# Patient Record
Sex: Male | Born: 1950
Health system: Southern US, Community
[De-identification: ages and names within clinical notes are randomized; demographics above are authoritative.]

## PROBLEM LIST (undated history)

## (undated) DIAGNOSIS — I10 Essential (primary) hypertension: Secondary | ICD-10-CM

## (undated) DIAGNOSIS — C61 Malignant neoplasm of prostate: Secondary | ICD-10-CM

## (undated) DIAGNOSIS — C4490 Unspecified malignant neoplasm of skin, unspecified: Secondary | ICD-10-CM

## (undated) DIAGNOSIS — E7800 Pure hypercholesterolemia, unspecified: Secondary | ICD-10-CM

## (undated) DIAGNOSIS — E119 Type 2 diabetes mellitus without complications: Secondary | ICD-10-CM

## (undated) HISTORY — PX: COLONOSCOPY: SHX5424

---

## 1998-06-01 ENCOUNTER — Encounter: Admission: RE | Admit: 1998-06-01 | Discharge: 1998-08-30 | Payer: Self-pay | Admitting: Internal Medicine

## 2012-01-07 ENCOUNTER — Other Ambulatory Visit: Payer: Self-pay | Admitting: Orthopedic Surgery

## 2012-01-07 DIAGNOSIS — M549 Dorsalgia, unspecified: Secondary | ICD-10-CM

## 2012-01-07 DIAGNOSIS — M79604 Pain in right leg: Secondary | ICD-10-CM

## 2012-01-07 DIAGNOSIS — M47817 Spondylosis without myelopathy or radiculopathy, lumbosacral region: Secondary | ICD-10-CM

## 2012-01-10 ENCOUNTER — Ambulatory Visit
Admission: RE | Admit: 2012-01-10 | Discharge: 2012-01-10 | Disposition: A | Discharge status: Home / Self Care | Payer: Self-pay | Source: Ambulatory Visit | Attending: Orthopedic Surgery | Admitting: Orthopedic Surgery

## 2012-01-10 ENCOUNTER — Ambulatory Visit
Admission: RE | Admit: 2012-01-10 | Discharge: 2012-01-10 | Disposition: A | Discharge status: Home / Self Care | Payer: BC Managed Care – PPO | Source: Ambulatory Visit | Attending: Orthopedic Surgery | Admitting: Orthopedic Surgery

## 2012-01-10 ENCOUNTER — Other Ambulatory Visit: Payer: Self-pay

## 2012-01-10 ENCOUNTER — Other Ambulatory Visit: Payer: Self-pay | Admitting: Orthopedic Surgery

## 2012-01-10 VITALS — BP 128/78 | HR 80 | Ht 68.0 in | Wt 190.0 lb

## 2012-01-10 DIAGNOSIS — M549 Dorsalgia, unspecified: Secondary | ICD-10-CM

## 2012-01-10 DIAGNOSIS — R52 Pain, unspecified: Secondary | ICD-10-CM

## 2012-01-10 DIAGNOSIS — M47817 Spondylosis without myelopathy or radiculopathy, lumbosacral region: Secondary | ICD-10-CM

## 2012-01-10 DIAGNOSIS — M79604 Pain in right leg: Secondary | ICD-10-CM

## 2012-01-10 MED ORDER — DIAZEPAM 5 MG PO TABS
10.0000 mg | ORAL_TABLET | Freq: Once | ORAL | Status: AC
  Administered 2012-01-10: 10 mg via ORAL

## 2012-01-10 MED ORDER — IOHEXOL 180 MG/ML  SOLN
18.0000 mL | Freq: Once | INTRAMUSCULAR | Status: AC | PRN
  Administered 2012-01-10: 18 mL via INTRATHECAL

## 2012-01-16 ENCOUNTER — Other Ambulatory Visit (HOSPITAL_COMMUNITY): Payer: Self-pay | Admitting: Orthopedic Surgery

## 2012-01-16 DIAGNOSIS — M549 Dorsalgia, unspecified: Secondary | ICD-10-CM

## 2012-01-25 ENCOUNTER — Ambulatory Visit (HOSPITAL_COMMUNITY): Payer: BC Managed Care – PPO

## 2012-01-25 ENCOUNTER — Encounter (HOSPITAL_COMMUNITY): Payer: BC Managed Care – PPO

## 2013-04-09 IMAGING — RF DG MYELOGRAM LUMBAR
11 series · 11 of 11 positions shown · IV contrast (omnipaque)
Comparison: MRI 11/15/2011 done at [REDACTED]

CLINICAL DATA: Right hip and groin pain.

 MYELOGRAM INJECTION
TECHNIQUE: Informed consent was obtained from the patient prior to
the procedure, including potential complications of headache,
allergy, infection and pain.  A timeout procedure was performed.
With the patient prone, the lower back was prepped with Betadine.
1% Lidocaine was used for local anesthesia.  Lumbar puncture was
performed at the left L3-4 level using a 22 gauge needle with
return of clear CSF.  16 ml of Omnipaque 488was injected into the
subarachnoid space .
TECHNIQUE: Following injection of intrathecal Omnipaque contrast,
spine imaging in multiple projections was performed using
fluoroscopy.
Fluoroscopy Time: 52 seconds .
TECHNIQUE: CT imaging of the lumbar spine was performed after
intrathecal contrast administration.  Multiplanar CT image
reconstructions were also generated.

[Series 1: (hospital) · 1 of 1 slices shown]
[im 1/1]
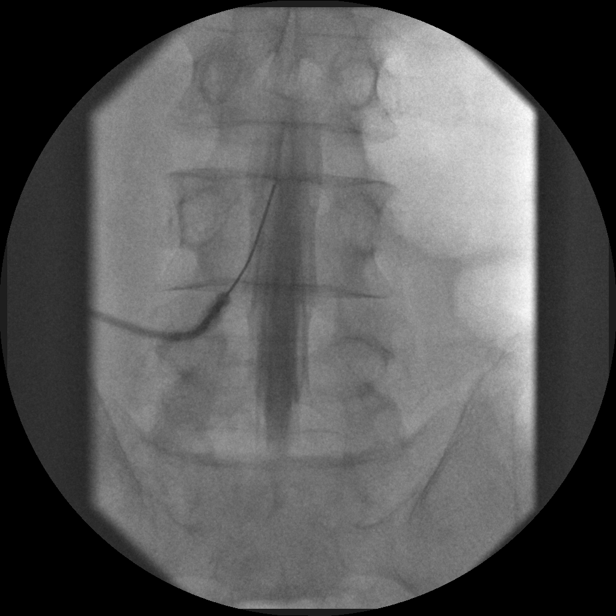

[Series 2: myelogram  white · 1 of 1 slices shown (1 of 7)]
[im 1/1]
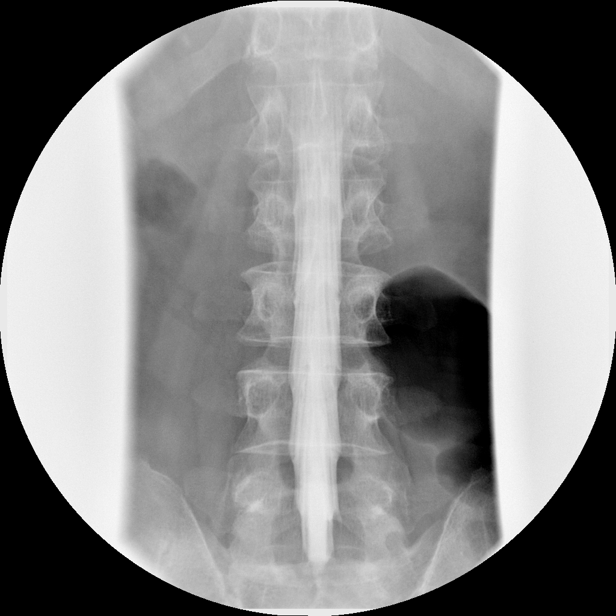

[Series 3: myelogram  white · 1 of 1 slices shown (2 of 7)]
[im 1/1]
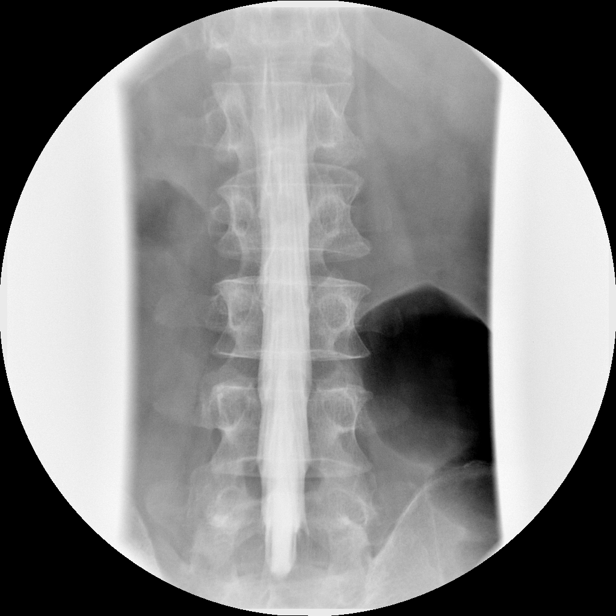

[Series 4: myelogram  white · 1 of 1 slices shown (3 of 7)]
[im 1/1]
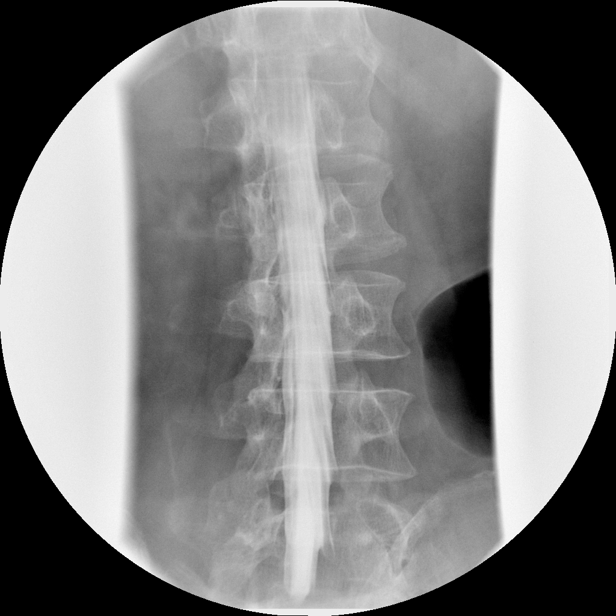

[Series 5: myelogram  white · 1 of 1 slices shown (4 of 7)]
[im 1/1]
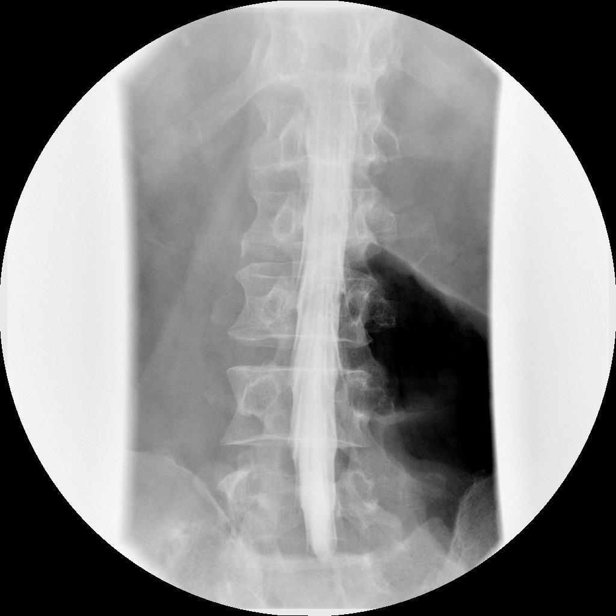

[Series 6: myelogram  white · 1 of 1 slices shown (5 of 7)]
[im 1/1]
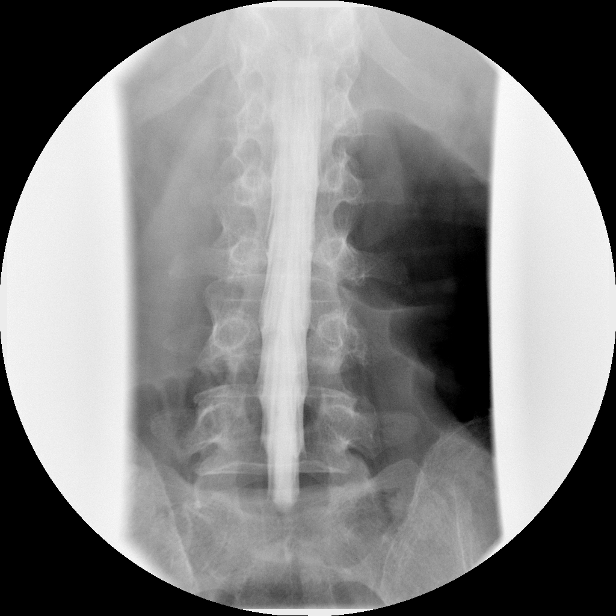

[Series 7: myelogram  white · 1 of 1 slices shown (6 of 7)]
[im 1/1]
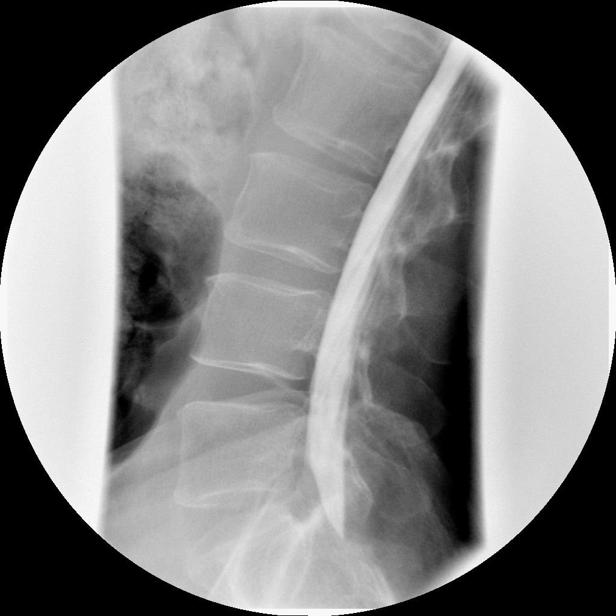

[Series 8: myelogram  white · 1 of 1 slices shown (7 of 7)]
[im 1/1]
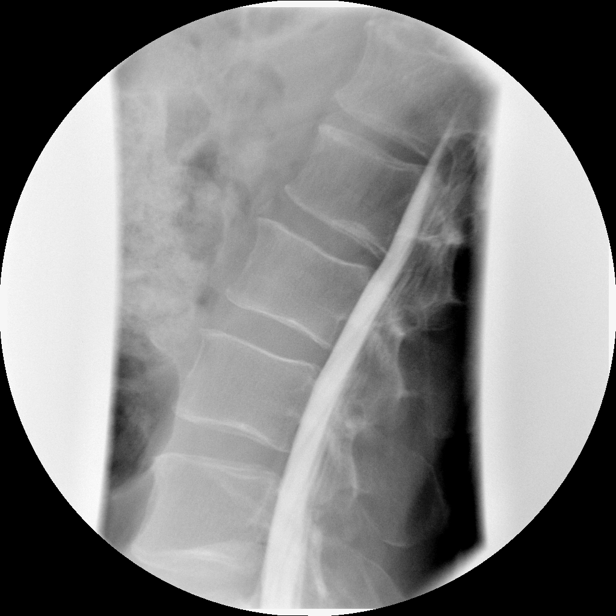

[Series 1001: view not recorded · 0.20mm/px · 1 of 1 slices shown (1 of 3)]
[im 1/1]
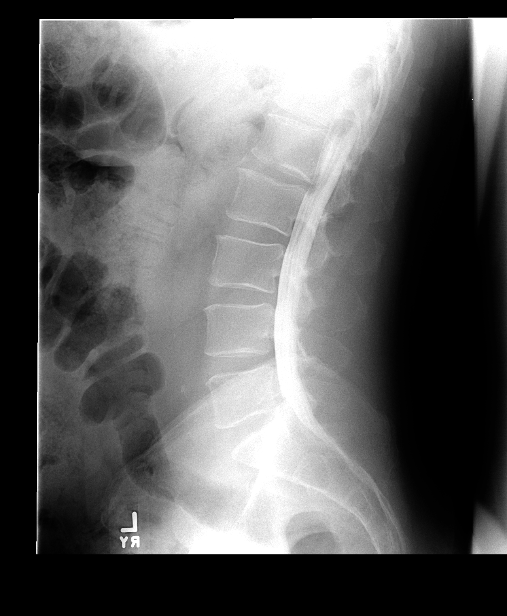

[Series 1002: view not recorded · 0.20mm/px · 1 of 1 slices shown (2 of 3)]
[im 1/1]
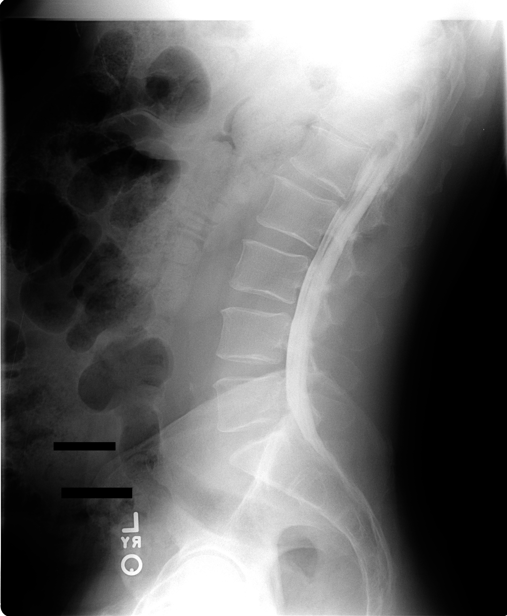

[Series 1003: view not recorded · 0.20mm/px · 1 of 1 slices shown (3 of 3)]
[im 1/1]
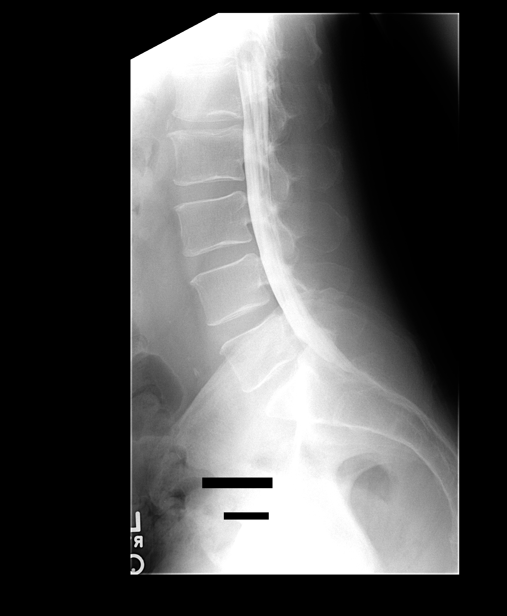

[11 of 11 positions shown; findings below may reference images not displayed]

IMPRESSION: Successful injection of  intrathecal contrast for myelography.

MYELOGRAM LUMBAR
FINDINGS: There are no anterior extradural defects.  There is
normal root sleeve filling throughout the region.  Standing
flexion/extension views do not show any abnormal motion.
IMPRESSION: Normal lumbar myelogram

CT MYELOGRAPHY LUMBAR SPINE
FINDINGS: T12-L1:  Normal interspace.

L1-2:  Conus tip at L1.  Nerve roots of the cauda quinine appear
normal.  Minimal leftward bulging of the disc.  No apparent neural
compression.

L2-3:  Normal interspace.

L3-4:  Normal interspace.

L4-5:  Normal appearance of the disc.  Very minimal facet
hypertrophy.  No stenosis or neural compression.

L5-S1:  Normal appearance of the disc.  No stenosis.

There are solid bridging osteophytes crossing the sacroiliac joint
on the left.  There is sacroiliac arthropathy on the right without
effusion.  Could this be a cause of pain?

Paravertebral soft tissues are unremarkable except for some
calcification of the thoracic aorta.

There is a 2 mm nodule associated with a left sided nerve root at
the level of the superior endplate of L5.  This looks like the left
S2 nerve root.
IMPRESSION: No significant disc pathology.  Normal exam of the discs with the
exception of a very minor leftward disc bulge at L1-2.

Sacroiliac arthropathy with solid bridging osteophytes on the left
and arthropathy on the right that could possibly be symptomatic.
Could the patient's clinical syndrome relate to sacroiliac
arthropathy?

Very minimal facet degeneration at L4-5.

2 mm nodule along a left sided nerve root at the level of the
superior endplate of L5.  This looks like the left S2 nerve root.
This is presumed to represent a small insignificant neurofibroma.

## 2016-07-04 HISTORY — PX: PROSTATE BIOPSY: SHX241

## 2016-08-30 ENCOUNTER — Encounter: Payer: Self-pay | Admitting: Radiation Oncology

## 2016-09-11 ENCOUNTER — Encounter: Payer: Self-pay | Admitting: Radiation Oncology

## 2016-09-11 DIAGNOSIS — C61 Malignant neoplasm of prostate: Secondary | ICD-10-CM | POA: Insufficient documentation

## 2016-09-11 NOTE — Progress Notes (Signed)
GU Location of Tumor / Histology: prostatic adenocarcinoma  If Prostate Cancer, Gleason Score is (3 + 4) and PSA is (4.76). Prostate volume: 22.1 cc.  Anthony Moyer was referred to Dr. Diona Fanti by Dr. Marcellus Scott for evaluation of an elevated PSA in June 2018.   02/22/2015 PSA 3.57 03/16/2016 PSA 4.40 04/17/2016 PSA 4.76   Biopsies of prostate (if applicable) revealed:    Past/Anticipated interventions by urology, if any: consult, biopsy, referral to radiation oncology to discuss brachytherapy  Past/Anticipated interventions by medical oncology, if any: No  Weight changes, if any: no  Bowel/Bladder complaints, if any: IPSS:  2   . Nocturia x 1.  Denies dysuria, hematuria, leakage or incontinence.  Nausea/Vomiting, if any: no  Pain issues, if any:  Occasional heaviness in groin "where prostate is."  SAFETY ISSUES:  Prior radiation? no  Pacemaker/ICD? no  Possible current pregnancy? no  Is the patient on methotrexate? no  Current Complaints / other details:  66 year old male. Married with one son and one daughter. Works as a Engineer, maintenance (IT).   Most interested in brachytherapy. Wife concerned about healing since he is a diabetic. Patient questions if he should cancel colonoscopy in October???

## 2016-09-12 ENCOUNTER — Encounter: Payer: Self-pay | Admitting: Radiation Oncology

## 2016-09-12 ENCOUNTER — Ambulatory Visit
Admission: RE | Admit: 2016-09-12 | Discharge: 2016-09-12 | Disposition: A | Discharge status: Home / Self Care | Payer: BC Managed Care – PPO | Source: Ambulatory Visit | Attending: Radiation Oncology | Admitting: Radiation Oncology

## 2016-09-12 DIAGNOSIS — Z85820 Personal history of malignant melanoma of skin: Secondary | ICD-10-CM | POA: Diagnosis not present

## 2016-09-12 DIAGNOSIS — Z510 Encounter for antineoplastic radiation therapy: Secondary | ICD-10-CM | POA: Insufficient documentation

## 2016-09-12 DIAGNOSIS — C61 Malignant neoplasm of prostate: Secondary | ICD-10-CM | POA: Diagnosis present

## 2016-09-12 DIAGNOSIS — Z7982 Long term (current) use of aspirin: Secondary | ICD-10-CM | POA: Diagnosis not present

## 2016-09-12 DIAGNOSIS — E7800 Pure hypercholesterolemia, unspecified: Secondary | ICD-10-CM | POA: Diagnosis not present

## 2016-09-12 DIAGNOSIS — I10 Essential (primary) hypertension: Secondary | ICD-10-CM | POA: Insufficient documentation

## 2016-09-12 HISTORY — DX: Unspecified malignant neoplasm of skin, unspecified: C44.90

## 2016-09-12 HISTORY — DX: Malignant neoplasm of prostate: C61

## 2016-09-12 HISTORY — DX: Essential (primary) hypertension: I10

## 2016-09-12 HISTORY — DX: Pure hypercholesterolemia, unspecified: E78.00

## 2016-09-12 NOTE — Progress Notes (Signed)
Radiation Oncology         [phone] ________________________________  Initial Outpatient Consultation  Name: Anthony Moyer MRN: [id number]  Date: 09/12/2016  DOB: 11-20-1950  CH:YIFOYD, Pcp Not In  Franchot Gallo, MD   REFERRING PHYSICIAN: Franchot Gallo, MD  DIAGNOSIS: 66 y.o. gentleman with Stage T2a adenocarcinoma of the prostate with Gleason Score of 3+4 and PSA of 4.76    ICD-10-CM   1. Malignant neoplasm of prostate ([place]) C61     HISTORY OF PRESENT ILLNESS: Anthony Moyer is a 66 y.o. male with a diagnosis of prostate cancer. He was noted to have an elevated PSA of 4.76 by his primary care physician, Dr. Marcellus Scott.  Accordingly, he was referred for evaluation in urology by Dr. Diona Fanti on 06/11/2016, where a digital rectal examination was performed at that time revealing a 4 mm prostate nodule in the right mid gland.  The patient proceeded to transrectal ultrasound with 12 biopsies of the prostate on 07/04/2016.  The prostate volume measured 22.1 cc.  Out of 12 core biopsies, 5 were positive.  The maximum Gleason score was 3+4, and this was seen in the right mid lateral, right apex and right apex lateral. There was Gleason 3+3 disease in the right mid and right base lateral.  He has no family history of prostate cancer.  Biopsies of prostate revealed:   PSA trend over the past year: 02/22/2015        PSA     3.57 03/16/2016          PSA     4.40 04/17/2016        PSA     4.76       The patient reviewed the biopsy results with his urologist and he has kindly been referred today for discussion of potential radiation treatment options. He is accompanied by his wife.  PREVIOUS RADIATION THERAPY: No  PAST MEDICAL HISTORY:  Past Medical History:  Diagnosis Date  . Hypercholesterolemia   . Hypertension   . Prostate cancer ([place])   . Skin cancer    melanoma in situ, unspecified      PAST SURGICAL HISTORY: Past Surgical History:  Procedure Laterality Date  .  PROSTATE BIOPSY  07/04/2016    FAMILY HISTORY:  Family History  Problem Relation Age of Onset  . Diabetes Mother   . Diabetes Father   . Heart failure Father   . Cancer Father        melanoma    SOCIAL HISTORY: Married with one son and one daughter. Works as a Engineer, maintenance (IT).  Social History   Social History  . Marital status: Married    Spouse name: N/A  . Number of children: N/A  . Years of education: N/A   Occupational History  . Not on file.   Social History Main Topics  . Smoking status: Never Smoker  . Smokeless tobacco: Never Used  . Alcohol use Yes  . Drug use: No  . Sexual activity: Not on file   Other Topics Concern  . Not on file   Social History Narrative  . No narrative on file    ALLERGIES: Penicillins  MEDICATIONS:  Current Outpatient Prescriptions  Medication Sig Dispense Refill  . insulin degludec (TRESIBA FLEXTOUCH) 100 UNIT/ML SOPN FlexTouch Pen Inject into the skin daily at 10 pm.    . lisinopril (PRINIVIL,ZESTRIL) 5 MG tablet Take 5 mg by mouth daily.    . metFORMIN (GLUCOPHAGE) 1000 MG tablet Take 1,000 mg  by mouth 2 (two) times daily with a meal.    . simvastatin (ZOCOR) 20 MG tablet Take 20 mg by mouth daily.    Marland Kitchen aspirin EC 81 MG tablet Take 81 mg by mouth daily.    . cholecalciferol (VITAMIN D) 1000 units tablet Take 1,000 Units by mouth daily.     No current facility-administered medications for this encounter.     REVIEW OF SYSTEMS:  On review of systems, the patient reports that he is doing well overall. He denies any chest pain, shortness of breath, cough, fevers, chills, night sweats, or unintended weight changes. He reports feeling occasional heaviness in his groin. He denies any bowel disturbances, and denies abdominal pain, nausea or vomiting. He denies any new musculoskeletal or joint aches or pains. He is a diabetic and has peripheral neuropathy in his feet. His IPSS was 2, indicating mild urinary symptoms, including nocturia x1. He  denies dysuria, hematuria, leakage or incontinence. He is unable to complete sexual activity with most attempts. A complete review of systems is obtained and is otherwise negative.    PHYSICAL EXAM:  Wt Readings from Last 3 Encounters:  09/12/16 190 lb 9.6 oz (86.5 kg)  01/10/12 190 lb (86.2 kg)   Temp Readings from Last 3 Encounters:  09/12/16 98.2 F (36.8 C) (Oral)   BP Readings from Last 3 Encounters:  09/12/16 (!) 145/75  01/10/12 128/78   Pulse Readings from Last 3 Encounters:  09/12/16 80  01/10/12 80   Pain Assessment Pain Score: 0-No pain/10  In general this is a well appearing Caucasian male in no acute distress. He is alert and oriented x4 and appropriate throughout the examination. Cardiovascular exam reveals a regular rate and rhythm, no clicks rubs or murmurs are auscultated. Chest is clear to auscultation bilaterally. Lymphatic assessment is performed and does not reveal any adenopathy in the cervical, supraclavicular, axillary, or inguinal chains. Abdomen has active bowel sounds in all quadrants and is intact. The abdomen is soft, non tender, non distended. Lower extremities are negative for pretibial pitting edema, deep calf tenderness, cyanosis or clubbing.   KPS = 100  100 - Normal; no complaints; no evidence of disease. 90   - Able to carry on normal activity; minor signs or symptoms of disease. 80   - Normal activity with effort; some signs or symptoms of disease. 60   - Cares for self; unable to carry on normal activity or to do active work. 60   - Requires occasional assistance, but is able to care for most of his personal needs. 50   - Requires considerable assistance and frequent medical care. 36   - Disabled; requires special care and assistance. 66   - Severely disabled; hospital admission is indicated although death not imminent. 80   - Very sick; hospital admission necessary; active supportive treatment necessary. 10   - Moribund; fatal processes  progressing rapidly. 0     - Dead  Karnofsky DA, Abelmann WH, Craver LS and Burchenal JH [phone]) The use of the nitrogen mustards in the palliative treatment of carcinoma: with particular reference to bronchogenic carcinoma Cancer 1 [phone]  LABORATORY DATA:  No results found for: WBC, HGB, HCT, MCV, PLT No results found for: NA, K, CL, CO2 No results found for: ALT, AST, GGT, ALKPHOS, BILITOT   RADIOGRAPHY: No results found.    IMPRESSION/PLAN: 1. 67 y.o. gentleman with Stage T2a adenocarcinoma of the prostate with Gleason Score of 3+4, and PSA of 4.76.  We  discussed the patient's workup and outlined the nature of prostate cancer in this setting. The patient's T stage, Gleason's score, and PSA put him into the favorable intermediate risk group. Accordingly, he is eligible for a variety of potential treatment options including brachytherapy or 8 weeks of external radiation. We discussed the available radiation techniques and focused on the details and logistics and delivery.  We discussed and outlined the risks, benefits, short and long-term effects associated with radiotherapy and compared and contrasted these with prostatectomy. The patient expressed interest in external beam radiotherapy as he has concerns about post procedure LUTS as well as radiation safety around his 89 month old granddaughter.  At the end of our discussion, the patient elects to proceed with prostate IMRT. He is scheduled for his annual colonoscopy on October 9th and would like to proceed with EBRT after this procedure is completed.  We will share our findings with Dr. Diona Fanti and move forward with scheduling placement of three gold fiducial markers into the prostate to proceed with IMRT in the near future.  It is felt that the patient may not benefit as much from the [place] given the small size of his prostate but we will plan to further discuss with Dr. Diona Fanti regarding his opinion.  We enjoyed meeting with him today,  and will look forward to participating in the care of this very nice gentleman. We spent 60 minutes face to face with the patient and more than 50% of that time was spent in counseling and/or coordination of care.   Nicholos Johns, PA-C    Tyler Pita, MD  [facility] Direct Dial: [phone]  Fax: [phone] [email or web address]  Skype  [email or web address]  This document serves as a record of services personally performed by Tyler Pita, MD and Freeman Caldron, PA-C. It was created on their behalf by Rae Lips, a trained medical scribe. The creation of this record is based on the scribe's personal observations and the providers' statements to them. This document has been checked and approved by the attending providers.

## 2016-09-12 NOTE — Progress Notes (Signed)
See progress noted under physician encounter.  

## 2016-09-13 ENCOUNTER — Telehealth: Payer: Self-pay | Admitting: Medical Oncology

## 2016-09-13 NOTE — Telephone Encounter (Signed)
Left a message to introduce myself as nurse navigator. I was unable to meet him yesterday when he consulted with Dr. Tammi Klippel. He will begin radiation in the fall for his prostate cancer. I asked him to call with questions or concerns.

## 2016-10-05 ENCOUNTER — Telehealth: Payer: Self-pay | Admitting: *Deleted

## 2016-10-05 NOTE — Telephone Encounter (Signed)
Called patient to inform of gold seed placement on 10-12-16 @ 10:30 am  @ Dr. Alan Ripper  Office, and sim on 10-18-16 @ 11:00 am @ Dr. Johny Shears Office, spoke with patient's wife- Ivin Booty and she is aware of these appts.

## 2016-10-17 NOTE — Progress Notes (Signed)
  Radiation Oncology         [phone] ________________________________  Name: Anthony Moyer MRN: [id number]  Date: 10/18/2016  DOB: 1950/05/06  SIMULATION AND TREATMENT PLANNING NOTE    ICD-10-CM   1. Malignant neoplasm of prostate ([place]) C61     DIAGNOSIS:  66 y.o. gentleman with Stage T2a adenocarcinoma of the prostate with Gleason Score of 3+4 and PSA of 4.76  NARRATIVE:  The patient was brought to the [facility].  Identity was confirmed.  All relevant records and images related to the planned course of therapy were reviewed.  The patient freely provided informed written consent to proceed with treatment after reviewing the details related to the planned course of therapy. The consent form was witnessed and verified by the simulation staff.  Then, the patient was set-up in a stable reproducible supine position for radiation therapy.  A vacuum lock pillow device was custom fabricated to position his legs in a reproducible immobilized position.  Then, I performed a urethrogram under sterile conditions to identify the prostatic apex.  CT images were obtained.  Surface markings were placed.  The CT images were loaded into the planning software.  Then the prostate target and avoidance structures including the rectum, bladder, bowel and hips were contoured.  Treatment planning then occurred.  The radiation prescription was entered and confirmed.  A total of one complex treatment devices was fabricated. I have requested : Intensity Modulated Radiotherapy (IMRT) is medically necessary for this case for the following reason:  Rectal sparing.Marland Kitchen  PLAN:  The patient will receive 78 Gy in 40 fractions.  ________________________________  Sheral Apley Tammi Klippel, M.D.

## 2016-10-18 ENCOUNTER — Encounter: Payer: Self-pay | Admitting: Medical Oncology

## 2016-10-18 ENCOUNTER — Ambulatory Visit
Admission: RE | Admit: 2016-10-18 | Discharge: 2016-10-18 | Disposition: A | Discharge status: Home / Self Care | Payer: BC Managed Care – PPO | Source: Ambulatory Visit | Attending: Radiation Oncology | Admitting: Radiation Oncology

## 2016-10-18 DIAGNOSIS — C61 Malignant neoplasm of prostate: Secondary | ICD-10-CM

## 2016-10-18 DIAGNOSIS — Z510 Encounter for antineoplastic radiation therapy: Secondary | ICD-10-CM | POA: Diagnosis not present

## 2016-10-24 DIAGNOSIS — Z510 Encounter for antineoplastic radiation therapy: Secondary | ICD-10-CM | POA: Diagnosis not present

## 2016-10-29 ENCOUNTER — Ambulatory Visit
Admission: RE | Admit: 2016-10-29 | Discharge: 2016-10-29 | Disposition: A | Discharge status: Home / Self Care | Payer: BC Managed Care – PPO | Source: Ambulatory Visit | Attending: Radiation Oncology | Admitting: Radiation Oncology

## 2016-10-29 DIAGNOSIS — Z510 Encounter for antineoplastic radiation therapy: Secondary | ICD-10-CM | POA: Diagnosis not present

## 2016-10-30 ENCOUNTER — Ambulatory Visit
Admission: RE | Admit: 2016-10-30 | Discharge: 2016-10-30 | Disposition: A | Discharge status: Home / Self Care | Payer: BC Managed Care – PPO | Source: Ambulatory Visit | Attending: Radiation Oncology | Admitting: Radiation Oncology

## 2016-10-30 DIAGNOSIS — Z510 Encounter for antineoplastic radiation therapy: Secondary | ICD-10-CM | POA: Diagnosis not present

## 2016-10-31 ENCOUNTER — Ambulatory Visit
Admission: RE | Admit: 2016-10-31 | Discharge: 2016-10-31 | Disposition: A | Discharge status: Home / Self Care | Payer: BC Managed Care – PPO | Source: Ambulatory Visit | Attending: Radiation Oncology | Admitting: Radiation Oncology

## 2016-10-31 DIAGNOSIS — Z510 Encounter for antineoplastic radiation therapy: Secondary | ICD-10-CM | POA: Diagnosis not present

## 2016-11-01 ENCOUNTER — Ambulatory Visit
Admission: RE | Admit: 2016-11-01 | Discharge: 2016-11-01 | Disposition: A | Discharge status: Home / Self Care | Payer: BC Managed Care – PPO | Source: Ambulatory Visit | Attending: Radiation Oncology | Admitting: Radiation Oncology

## 2016-11-01 DIAGNOSIS — Z510 Encounter for antineoplastic radiation therapy: Secondary | ICD-10-CM | POA: Diagnosis not present

## 2016-11-02 ENCOUNTER — Ambulatory Visit
Admission: RE | Admit: 2016-11-02 | Discharge: 2016-11-02 | Disposition: A | Discharge status: Home / Self Care | Payer: BC Managed Care – PPO | Source: Ambulatory Visit | Attending: Radiation Oncology | Admitting: Radiation Oncology

## 2016-11-02 DIAGNOSIS — Z510 Encounter for antineoplastic radiation therapy: Secondary | ICD-10-CM | POA: Diagnosis not present

## 2016-11-05 ENCOUNTER — Ambulatory Visit
Admission: RE | Admit: 2016-11-05 | Discharge: 2016-11-05 | Disposition: A | Discharge status: Home / Self Care | Payer: BC Managed Care – PPO | Source: Ambulatory Visit | Attending: Radiation Oncology | Admitting: Radiation Oncology

## 2016-11-05 DIAGNOSIS — Z510 Encounter for antineoplastic radiation therapy: Secondary | ICD-10-CM | POA: Diagnosis not present

## 2016-11-06 ENCOUNTER — Ambulatory Visit
Admission: RE | Admit: 2016-11-06 | Discharge: 2016-11-06 | Disposition: A | Discharge status: Home / Self Care | Payer: BC Managed Care – PPO | Source: Ambulatory Visit | Attending: Radiation Oncology | Admitting: Radiation Oncology

## 2016-11-06 DIAGNOSIS — Z510 Encounter for antineoplastic radiation therapy: Secondary | ICD-10-CM | POA: Diagnosis not present

## 2016-11-07 ENCOUNTER — Ambulatory Visit
Admission: RE | Admit: 2016-11-07 | Discharge: 2016-11-07 | Disposition: A | Discharge status: Home / Self Care | Payer: BC Managed Care – PPO | Source: Ambulatory Visit | Attending: Radiation Oncology | Admitting: Radiation Oncology

## 2016-11-07 DIAGNOSIS — Z510 Encounter for antineoplastic radiation therapy: Secondary | ICD-10-CM | POA: Diagnosis not present

## 2016-11-08 ENCOUNTER — Ambulatory Visit
Admission: RE | Admit: 2016-11-08 | Discharge: 2016-11-08 | Disposition: A | Discharge status: Home / Self Care | Payer: BC Managed Care – PPO | Source: Ambulatory Visit | Attending: Radiation Oncology | Admitting: Radiation Oncology

## 2016-11-08 DIAGNOSIS — Z510 Encounter for antineoplastic radiation therapy: Secondary | ICD-10-CM | POA: Diagnosis not present

## 2016-11-09 ENCOUNTER — Ambulatory Visit
Admission: RE | Admit: 2016-11-09 | Discharge: 2016-11-09 | Disposition: A | Discharge status: Home / Self Care | Payer: BC Managed Care – PPO | Source: Ambulatory Visit | Attending: Radiation Oncology | Admitting: Radiation Oncology

## 2016-11-09 DIAGNOSIS — Z510 Encounter for antineoplastic radiation therapy: Secondary | ICD-10-CM | POA: Diagnosis not present

## 2016-11-12 ENCOUNTER — Ambulatory Visit
Admission: RE | Admit: 2016-11-12 | Discharge: 2016-11-12 | Disposition: A | Discharge status: Home / Self Care | Payer: BC Managed Care – PPO | Source: Ambulatory Visit | Attending: Radiation Oncology | Admitting: Radiation Oncology

## 2016-11-12 DIAGNOSIS — Z510 Encounter for antineoplastic radiation therapy: Secondary | ICD-10-CM | POA: Diagnosis not present

## 2016-11-13 ENCOUNTER — Ambulatory Visit
Admission: RE | Admit: 2016-11-13 | Discharge: 2016-11-13 | Disposition: A | Discharge status: Home / Self Care | Payer: BC Managed Care – PPO | Source: Ambulatory Visit | Attending: Radiation Oncology | Admitting: Radiation Oncology

## 2016-11-13 DIAGNOSIS — Z510 Encounter for antineoplastic radiation therapy: Secondary | ICD-10-CM | POA: Diagnosis not present

## 2016-11-14 ENCOUNTER — Ambulatory Visit
Admission: RE | Admit: 2016-11-14 | Discharge: 2016-11-14 | Disposition: A | Discharge status: Home / Self Care | Payer: BC Managed Care – PPO | Source: Ambulatory Visit | Attending: Radiation Oncology | Admitting: Radiation Oncology

## 2016-11-14 DIAGNOSIS — Z510 Encounter for antineoplastic radiation therapy: Secondary | ICD-10-CM | POA: Diagnosis not present

## 2016-11-15 ENCOUNTER — Ambulatory Visit
Admission: RE | Admit: 2016-11-15 | Discharge: 2016-11-15 | Disposition: A | Discharge status: Home / Self Care | Payer: BC Managed Care – PPO | Source: Ambulatory Visit | Attending: Radiation Oncology | Admitting: Radiation Oncology

## 2016-11-15 DIAGNOSIS — Z510 Encounter for antineoplastic radiation therapy: Secondary | ICD-10-CM | POA: Diagnosis not present

## 2016-11-16 ENCOUNTER — Ambulatory Visit
Admission: RE | Admit: 2016-11-16 | Discharge: 2016-11-16 | Disposition: A | Discharge status: Home / Self Care | Payer: BC Managed Care – PPO | Source: Ambulatory Visit | Attending: Radiation Oncology | Admitting: Radiation Oncology

## 2016-11-16 DIAGNOSIS — Z510 Encounter for antineoplastic radiation therapy: Secondary | ICD-10-CM | POA: Diagnosis not present

## 2016-11-19 ENCOUNTER — Ambulatory Visit
Admission: RE | Admit: 2016-11-19 | Discharge: 2016-11-19 | Disposition: A | Discharge status: Home / Self Care | Payer: BC Managed Care – PPO | Source: Ambulatory Visit | Attending: Radiation Oncology | Admitting: Radiation Oncology

## 2016-11-19 DIAGNOSIS — Z510 Encounter for antineoplastic radiation therapy: Secondary | ICD-10-CM | POA: Diagnosis not present

## 2016-11-20 ENCOUNTER — Ambulatory Visit
Admission: RE | Admit: 2016-11-20 | Discharge: 2016-11-20 | Disposition: A | Discharge status: Home / Self Care | Payer: BC Managed Care – PPO | Source: Ambulatory Visit | Attending: Radiation Oncology | Admitting: Radiation Oncology

## 2016-11-20 DIAGNOSIS — Z510 Encounter for antineoplastic radiation therapy: Secondary | ICD-10-CM | POA: Diagnosis not present

## 2016-11-21 ENCOUNTER — Ambulatory Visit
Admission: RE | Admit: 2016-11-21 | Discharge: 2016-11-21 | Disposition: A | Discharge status: Home / Self Care | Payer: BC Managed Care – PPO | Source: Ambulatory Visit | Attending: Radiation Oncology | Admitting: Radiation Oncology

## 2016-11-21 DIAGNOSIS — Z510 Encounter for antineoplastic radiation therapy: Secondary | ICD-10-CM | POA: Diagnosis not present

## 2016-11-22 ENCOUNTER — Ambulatory Visit
Admission: RE | Admit: 2016-11-22 | Discharge: 2016-11-22 | Disposition: A | Discharge status: Home / Self Care | Payer: BC Managed Care – PPO | Source: Ambulatory Visit | Attending: Radiation Oncology | Admitting: Radiation Oncology

## 2016-11-22 DIAGNOSIS — Z510 Encounter for antineoplastic radiation therapy: Secondary | ICD-10-CM | POA: Diagnosis not present

## 2016-11-23 ENCOUNTER — Encounter: Payer: Self-pay | Admitting: Medical Oncology

## 2016-11-23 ENCOUNTER — Ambulatory Visit
Admission: RE | Admit: 2016-11-23 | Discharge: 2016-11-23 | Disposition: A | Discharge status: Home / Self Care | Payer: BC Managed Care – PPO | Source: Ambulatory Visit | Attending: Radiation Oncology | Admitting: Radiation Oncology

## 2016-11-23 DIAGNOSIS — Z510 Encounter for antineoplastic radiation therapy: Secondary | ICD-10-CM | POA: Diagnosis not present

## 2016-11-23 NOTE — Progress Notes (Signed)
Anthony Moyer states he is doing well with radiation. He states that he is experiencing some urinary hesitancy but no major side effects at this time.

## 2016-11-25 ENCOUNTER — Ambulatory Visit
Admission: RE | Admit: 2016-11-25 | Discharge: 2016-11-25 | Disposition: A | Discharge status: Home / Self Care | Payer: BC Managed Care – PPO | Source: Ambulatory Visit | Attending: Radiation Oncology | Admitting: Radiation Oncology

## 2016-11-25 DIAGNOSIS — Z510 Encounter for antineoplastic radiation therapy: Secondary | ICD-10-CM | POA: Diagnosis not present

## 2016-11-26 ENCOUNTER — Ambulatory Visit
Admission: RE | Admit: 2016-11-26 | Discharge: 2016-11-26 | Disposition: A | Discharge status: Home / Self Care | Payer: BC Managed Care – PPO | Source: Ambulatory Visit | Attending: Radiation Oncology | Admitting: Radiation Oncology

## 2016-11-26 DIAGNOSIS — Z510 Encounter for antineoplastic radiation therapy: Secondary | ICD-10-CM | POA: Diagnosis not present

## 2016-11-27 ENCOUNTER — Ambulatory Visit
Admission: RE | Admit: 2016-11-27 | Discharge: 2016-11-27 | Disposition: A | Discharge status: Home / Self Care | Payer: BC Managed Care – PPO | Source: Ambulatory Visit | Attending: Radiation Oncology | Admitting: Radiation Oncology

## 2016-11-27 DIAGNOSIS — Z510 Encounter for antineoplastic radiation therapy: Secondary | ICD-10-CM | POA: Diagnosis not present

## 2016-11-28 ENCOUNTER — Ambulatory Visit
Admission: RE | Admit: 2016-11-28 | Discharge: 2016-11-28 | Disposition: A | Discharge status: Home / Self Care | Payer: BC Managed Care – PPO | Source: Ambulatory Visit | Attending: Radiation Oncology | Admitting: Radiation Oncology

## 2016-11-28 DIAGNOSIS — Z510 Encounter for antineoplastic radiation therapy: Secondary | ICD-10-CM | POA: Diagnosis not present

## 2016-12-03 ENCOUNTER — Ambulatory Visit
Admission: RE | Admit: 2016-12-03 | Discharge: 2016-12-03 | Disposition: A | Discharge status: Home / Self Care | Payer: BC Managed Care – PPO | Source: Ambulatory Visit | Attending: Radiation Oncology | Admitting: Radiation Oncology

## 2016-12-03 DIAGNOSIS — Z510 Encounter for antineoplastic radiation therapy: Secondary | ICD-10-CM | POA: Diagnosis not present

## 2016-12-04 ENCOUNTER — Ambulatory Visit
Admission: RE | Admit: 2016-12-04 | Discharge: 2016-12-04 | Disposition: A | Discharge status: Home / Self Care | Payer: BC Managed Care – PPO | Source: Ambulatory Visit | Attending: Radiation Oncology | Admitting: Radiation Oncology

## 2016-12-04 DIAGNOSIS — Z510 Encounter for antineoplastic radiation therapy: Secondary | ICD-10-CM | POA: Diagnosis not present

## 2016-12-05 ENCOUNTER — Ambulatory Visit
Admission: RE | Admit: 2016-12-05 | Discharge: 2016-12-05 | Disposition: A | Discharge status: Home / Self Care | Payer: BC Managed Care – PPO | Source: Ambulatory Visit | Attending: Radiation Oncology | Admitting: Radiation Oncology

## 2016-12-05 DIAGNOSIS — Z510 Encounter for antineoplastic radiation therapy: Secondary | ICD-10-CM | POA: Diagnosis not present

## 2016-12-06 ENCOUNTER — Ambulatory Visit
Admission: RE | Admit: 2016-12-06 | Discharge: 2016-12-06 | Disposition: A | Discharge status: Home / Self Care | Payer: BC Managed Care – PPO | Source: Ambulatory Visit | Attending: Radiation Oncology | Admitting: Radiation Oncology

## 2016-12-06 DIAGNOSIS — Z510 Encounter for antineoplastic radiation therapy: Secondary | ICD-10-CM | POA: Diagnosis not present

## 2016-12-07 ENCOUNTER — Ambulatory Visit
Admission: RE | Admit: 2016-12-07 | Discharge: 2016-12-07 | Disposition: A | Discharge status: Home / Self Care | Payer: BC Managed Care – PPO | Source: Ambulatory Visit | Attending: Radiation Oncology | Admitting: Radiation Oncology

## 2016-12-07 DIAGNOSIS — Z510 Encounter for antineoplastic radiation therapy: Secondary | ICD-10-CM | POA: Diagnosis not present

## 2016-12-10 ENCOUNTER — Ambulatory Visit
Admission: RE | Admit: 2016-12-10 | Discharge: 2016-12-10 | Disposition: A | Discharge status: Home / Self Care | Payer: BC Managed Care – PPO | Source: Ambulatory Visit | Attending: Radiation Oncology | Admitting: Radiation Oncology

## 2016-12-10 DIAGNOSIS — Z510 Encounter for antineoplastic radiation therapy: Secondary | ICD-10-CM | POA: Diagnosis not present

## 2016-12-11 ENCOUNTER — Ambulatory Visit
Admission: RE | Admit: 2016-12-11 | Discharge: 2016-12-11 | Disposition: A | Discharge status: Home / Self Care | Payer: BC Managed Care – PPO | Source: Ambulatory Visit | Attending: Radiation Oncology | Admitting: Radiation Oncology

## 2016-12-11 DIAGNOSIS — R351 Nocturia: Secondary | ICD-10-CM | POA: Insufficient documentation

## 2016-12-11 DIAGNOSIS — Z79899 Other long term (current) drug therapy: Secondary | ICD-10-CM | POA: Diagnosis not present

## 2016-12-11 DIAGNOSIS — R339 Retention of urine, unspecified: Secondary | ICD-10-CM | POA: Diagnosis not present

## 2016-12-11 DIAGNOSIS — Z7982 Long term (current) use of aspirin: Secondary | ICD-10-CM | POA: Diagnosis not present

## 2016-12-11 DIAGNOSIS — C61 Malignant neoplasm of prostate: Secondary | ICD-10-CM | POA: Diagnosis not present

## 2016-12-11 DIAGNOSIS — Z794 Long term (current) use of insulin: Secondary | ICD-10-CM | POA: Insufficient documentation

## 2016-12-11 DIAGNOSIS — Z510 Encounter for antineoplastic radiation therapy: Secondary | ICD-10-CM | POA: Insufficient documentation

## 2016-12-11 DIAGNOSIS — R350 Frequency of micturition: Secondary | ICD-10-CM | POA: Diagnosis not present

## 2016-12-12 ENCOUNTER — Ambulatory Visit
Admission: RE | Admit: 2016-12-12 | Discharge: 2016-12-12 | Disposition: A | Discharge status: Home / Self Care | Payer: BC Managed Care – PPO | Source: Ambulatory Visit | Attending: Radiation Oncology | Admitting: Radiation Oncology

## 2016-12-12 DIAGNOSIS — C61 Malignant neoplasm of prostate: Secondary | ICD-10-CM | POA: Diagnosis not present

## 2016-12-13 ENCOUNTER — Ambulatory Visit
Admission: RE | Admit: 2016-12-13 | Discharge: 2016-12-13 | Disposition: A | Discharge status: Home / Self Care | Payer: BC Managed Care – PPO | Source: Ambulatory Visit | Attending: Radiation Oncology | Admitting: Radiation Oncology

## 2016-12-13 DIAGNOSIS — C61 Malignant neoplasm of prostate: Secondary | ICD-10-CM | POA: Diagnosis not present

## 2016-12-14 ENCOUNTER — Ambulatory Visit
Admission: RE | Admit: 2016-12-14 | Discharge: 2016-12-14 | Disposition: A | Discharge status: Home / Self Care | Payer: BC Managed Care – PPO | Source: Ambulatory Visit | Attending: Radiation Oncology | Admitting: Radiation Oncology

## 2016-12-14 DIAGNOSIS — C61 Malignant neoplasm of prostate: Secondary | ICD-10-CM | POA: Diagnosis not present

## 2016-12-17 ENCOUNTER — Ambulatory Visit: Payer: BC Managed Care – PPO

## 2016-12-18 ENCOUNTER — Ambulatory Visit
Admission: RE | Admit: 2016-12-18 | Discharge: 2016-12-18 | Disposition: A | Discharge status: Home / Self Care | Payer: BC Managed Care – PPO | Source: Ambulatory Visit | Attending: Radiation Oncology | Admitting: Radiation Oncology

## 2016-12-18 DIAGNOSIS — C61 Malignant neoplasm of prostate: Secondary | ICD-10-CM | POA: Diagnosis not present

## 2016-12-19 ENCOUNTER — Ambulatory Visit
Admission: RE | Admit: 2016-12-19 | Discharge: 2016-12-19 | Disposition: A | Discharge status: Home / Self Care | Payer: BC Managed Care – PPO | Source: Ambulatory Visit | Attending: Radiation Oncology | Admitting: Radiation Oncology

## 2016-12-19 DIAGNOSIS — C61 Malignant neoplasm of prostate: Secondary | ICD-10-CM | POA: Diagnosis not present

## 2016-12-20 ENCOUNTER — Ambulatory Visit
Admission: RE | Admit: 2016-12-20 | Discharge: 2016-12-20 | Disposition: A | Discharge status: Home / Self Care | Payer: BC Managed Care – PPO | Source: Ambulatory Visit | Attending: Radiation Oncology | Admitting: Radiation Oncology

## 2016-12-20 DIAGNOSIS — C61 Malignant neoplasm of prostate: Secondary | ICD-10-CM | POA: Diagnosis not present

## 2016-12-21 ENCOUNTER — Ambulatory Visit
Admission: RE | Admit: 2016-12-21 | Discharge: 2016-12-21 | Disposition: A | Discharge status: Home / Self Care | Payer: BC Managed Care – PPO | Source: Ambulatory Visit | Attending: Radiation Oncology | Admitting: Radiation Oncology

## 2016-12-21 ENCOUNTER — Telehealth: Payer: Self-pay | Admitting: Radiation Oncology

## 2016-12-21 DIAGNOSIS — C61 Malignant neoplasm of prostate: Secondary | ICD-10-CM

## 2016-12-21 NOTE — Telephone Encounter (Signed)
Received word from Anthony Moyer that the patient left a voicemail message requesting this RN phone him. Phoned patient at number provided [phone]. No answer. Left message requesting a return call if need still present since PUT encounter. Awaiting return call.

## 2016-12-24 ENCOUNTER — Ambulatory Visit
Admission: RE | Admit: 2016-12-24 | Discharge: 2016-12-24 | Disposition: A | Discharge status: Home / Self Care | Payer: BC Managed Care – PPO | Source: Ambulatory Visit | Attending: Radiation Oncology | Admitting: Radiation Oncology

## 2016-12-24 ENCOUNTER — Ambulatory Visit: Payer: BC Managed Care – PPO

## 2016-12-24 DIAGNOSIS — C61 Malignant neoplasm of prostate: Secondary | ICD-10-CM | POA: Diagnosis not present

## 2016-12-25 ENCOUNTER — Ambulatory Visit
Admission: RE | Admit: 2016-12-25 | Discharge: 2016-12-25 | Disposition: A | Discharge status: Home / Self Care | Payer: BC Managed Care – PPO | Source: Ambulatory Visit | Attending: Radiation Oncology | Admitting: Radiation Oncology

## 2016-12-25 ENCOUNTER — Encounter: Payer: Self-pay | Admitting: Radiation Oncology

## 2016-12-25 ENCOUNTER — Ambulatory Visit: Payer: BC Managed Care – PPO

## 2016-12-25 DIAGNOSIS — C61 Malignant neoplasm of prostate: Secondary | ICD-10-CM | POA: Diagnosis not present

## 2016-12-28 NOTE — Progress Notes (Signed)
  Radiation Oncology         [phone] ________________________________  Name: Anthony Moyer MRN: [id number]  Date: 12/25/2016  DOB: 10-25-1950  End of Treatment Note  Diagnosis:  66 y.o. gentleman with Stage T2a adenocarcinoma of the prostate with Gleason Score of 3+4 and PSA of 4.76.       Indication for treatment:  Curative       Radiation treatment dates:   10/29/16 - 12/25/16  Site/dose:   The prostate was treated to 78 Gy in 40 fractions of 1.95 Gy  Beams/energy:   The patient was treated with IMRT using volumetric arc therapy delivering 6 MV X-rays to clockwise and counterclockwise circumferential arcs with a 90 degree collimator offset to avoid dose scalloping.  Image guidance was performed with daily cone beam CT prior to each fraction to align to gold markers in the prostate and assure proper bladder and rectal fill volumes.  Immobilization was achieved with BodyFix custom mold.  Narrative: The patient tolerated radiation treatment relatively well. He experienced some minor urinary irritation and modest fatigue. He reported urinary urgency, hesitancy, weakened urine stream and nocturia x3. He denied dysuria, hematuria or incomplete emptying. He denied straining to void or incontinence. He did not experience any significant bowel issues.  Plan: The patient has completed radiation treatment. The patient will return to radiation oncology clinic for routine followup in one month. I advised him to call or return sooner if he has any questions or concerns related to his recovery or treatment. ________________________________  Sheral Apley. Tammi Klippel, M.D.   This document serves as a record of services personally performed by Tyler Pita MD. It was created on his behalf by Delton Coombes, a trained medical scribe. The creation of this record is based on the scribe's personal observations and the provider's statements to them.

## 2017-01-30 ENCOUNTER — Ambulatory Visit
Admission: RE | Admit: 2017-01-30 | Discharge: 2017-01-30 | Disposition: A | Discharge status: Home / Self Care | Payer: BC Managed Care – PPO | Source: Ambulatory Visit | Attending: Urology | Admitting: Urology

## 2017-01-30 ENCOUNTER — Encounter: Payer: Self-pay | Admitting: Urology

## 2017-01-30 ENCOUNTER — Other Ambulatory Visit: Payer: Self-pay

## 2017-01-30 VITALS — BP 141/94 | HR 82 | Temp 98.0°F | Resp 20 | Ht 68.0 in | Wt 187.0 lb

## 2017-01-30 DIAGNOSIS — C61 Malignant neoplasm of prostate: Secondary | ICD-10-CM | POA: Diagnosis not present

## 2017-01-30 NOTE — Addendum Note (Signed)
Encounter addended by: Malena Edman, RN on: 01/30/2017 9:04 AM  Actions taken: Charge Capture section accepted

## 2017-01-30 NOTE — Progress Notes (Signed)
Radiation Oncology         [phone] ________________________________  Name: Anthony Moyer MRN: [id number]  Date: 01/30/2017  DOB: 08/24/1950  Post Treatment Note  CC: System, Pcp Not In  Franchot Gallo, MD  Diagnosis:   67 y.o. gentleman with Stage T2a adenocarcinoma of the prostate with Gleason Score of 3+4 and PSA of 4.76.  Interval Since Last Radiation:  5 weeks  10/29/16 - 12/25/16:   The prostate was treated to 78 Gy in 40 fractions of 1.95 Gy  Narrative:  The patient returns today for routine follow-up.  He tolerated radiation treatment relatively well with only minor urinary irritation and modest fatigue. He reported urinary urgency, hesitancy, weakened urine stream and nocturia x3. He denied dysuria, hematuria or incomplete emptying. He denied straining to void or incontinence. He did not experience any significant bowel issues.                          On review of systems, the patient states that he is doing well overall.  His current IPSS is 6, indicating mild LUTS with increased frequency, weak stream and occasional feelings of incomplete emptying.  He has nocturia 1-2/night which he attributes to drinking iced tea right up to time for bed.  He denies dysuria, gross hematuria, urgency or incontinence.  He reports a good appetite and is maintaining his weight. He denies abdominal pain, N/V or diarrhea.  He continues with soft BMs but regular in frequency. Overall, he is pleased with his progress.   ALLERGIES:  is allergic to penicillins.  Meds: Current Outpatient Medications  Medication Sig Dispense Refill  . aspirin EC 81 MG tablet Take 81 mg by mouth daily.    . cholecalciferol (VITAMIN D) 1000 units tablet Take 1,000 Units by mouth daily.    . insulin degludec (TRESIBA FLEXTOUCH) 100 UNIT/ML SOPN FlexTouch Pen Inject into the skin daily at 10 pm.    . lisinopril (PRINIVIL,ZESTRIL) 5 MG tablet Take 5 mg by mouth daily.    . metFORMIN (GLUCOPHAGE) 1000 MG tablet  Take 1,000 mg by mouth 2 (two) times daily with a meal.    . simvastatin (ZOCOR) 20 MG tablet Take 20 mg by mouth daily.     No current facility-administered medications for this encounter.     Physical Findings:  height is 5\' 8"  (1.727 m) and weight is 187 lb (84.8 kg). His oral temperature is 98 F (36.7 C). His blood pressure is 141/94 (abnormal) and his pulse is 82. His respiration is 20 and oxygen saturation is 99%.  Pain Assessment Pain Score: 0-No pain/10 In general this is a well appearing caucasian male in no acute distress. He's alert and oriented x4 and appropriate throughout the examination. Cardiopulmonary assessment is negative for acute distress and he exhibits normal effort.   Lab Findings: No results found for: WBC, HGB, HCT, MCV, PLT   Radiographic Findings: No results found.  Impression/Plan: 1. 67 y.o. gentleman with Stage T2a adenocarcinoma of the prostate with Gleason Score of 3+4 and PSA of 4.76. He will continue to follow up with urology for ongoing PSA determinations and will call for a follow up appointment with Dr. Diona Fanti in March 2019. He understands what to expect with regards to PSA monitoring going forward. I will look forward to following his response to treatment via correspondence with urology, and would be happy to continue to participate in his care if clinically indicated. I talked to  the patient about what to expect in the future, including his risk for erectile dysfunction and rectal bleeding. I encouraged him to call or return to the office if he has any questions regarding his previous radiation or possible radiation side effects. He was comfortable with this plan and will follow up as needed.    Nicholos Johns, PA-C

## 2019-06-24 DIAGNOSIS — E113311 Type 2 diabetes mellitus with moderate nonproliferative diabetic retinopathy with macular edema, right eye: Secondary | ICD-10-CM | POA: Diagnosis not present

## 2019-07-06 DIAGNOSIS — E113312 Type 2 diabetes mellitus with moderate nonproliferative diabetic retinopathy with macular edema, left eye: Secondary | ICD-10-CM | POA: Diagnosis not present

## 2019-08-03 DIAGNOSIS — E559 Vitamin D deficiency, unspecified: Secondary | ICD-10-CM | POA: Diagnosis not present

## 2019-08-03 DIAGNOSIS — I10 Essential (primary) hypertension: Secondary | ICD-10-CM | POA: Diagnosis not present

## 2019-08-03 DIAGNOSIS — E785 Hyperlipidemia, unspecified: Secondary | ICD-10-CM | POA: Diagnosis not present

## 2019-08-03 DIAGNOSIS — Z79899 Other long term (current) drug therapy: Secondary | ICD-10-CM | POA: Diagnosis not present

## 2019-08-03 DIAGNOSIS — E11311 Type 2 diabetes mellitus with unspecified diabetic retinopathy with macular edema: Secondary | ICD-10-CM | POA: Diagnosis not present

## 2019-08-03 DIAGNOSIS — E1142 Type 2 diabetes mellitus with diabetic polyneuropathy: Secondary | ICD-10-CM | POA: Diagnosis not present

## 2019-08-03 DIAGNOSIS — E1165 Type 2 diabetes mellitus with hyperglycemia: Secondary | ICD-10-CM | POA: Diagnosis not present

## 2019-08-03 DIAGNOSIS — N1832 Chronic kidney disease, stage 3b: Secondary | ICD-10-CM | POA: Diagnosis not present

## 2019-08-03 DIAGNOSIS — Z8546 Personal history of malignant neoplasm of prostate: Secondary | ICD-10-CM | POA: Diagnosis not present

## 2019-08-04 DIAGNOSIS — E113311 Type 2 diabetes mellitus with moderate nonproliferative diabetic retinopathy with macular edema, right eye: Secondary | ICD-10-CM | POA: Diagnosis not present

## 2019-08-05 DIAGNOSIS — E875 Hyperkalemia: Secondary | ICD-10-CM | POA: Diagnosis not present

## 2019-08-17 DIAGNOSIS — E113312 Type 2 diabetes mellitus with moderate nonproliferative diabetic retinopathy with macular edema, left eye: Secondary | ICD-10-CM | POA: Diagnosis not present

## 2019-09-15 DIAGNOSIS — E113311 Type 2 diabetes mellitus with moderate nonproliferative diabetic retinopathy with macular edema, right eye: Secondary | ICD-10-CM | POA: Diagnosis not present

## 2019-10-09 DIAGNOSIS — E113312 Type 2 diabetes mellitus with moderate nonproliferative diabetic retinopathy with macular edema, left eye: Secondary | ICD-10-CM | POA: Diagnosis not present

## 2019-11-02 DIAGNOSIS — E113311 Type 2 diabetes mellitus with moderate nonproliferative diabetic retinopathy with macular edema, right eye: Secondary | ICD-10-CM | POA: Diagnosis not present

## 2019-11-03 DIAGNOSIS — E559 Vitamin D deficiency, unspecified: Secondary | ICD-10-CM | POA: Diagnosis not present

## 2019-11-03 DIAGNOSIS — Z8546 Personal history of malignant neoplasm of prostate: Secondary | ICD-10-CM | POA: Diagnosis not present

## 2019-11-03 DIAGNOSIS — E1142 Type 2 diabetes mellitus with diabetic polyneuropathy: Secondary | ICD-10-CM | POA: Diagnosis not present

## 2019-11-03 DIAGNOSIS — E11311 Type 2 diabetes mellitus with unspecified diabetic retinopathy with macular edema: Secondary | ICD-10-CM | POA: Diagnosis not present

## 2019-11-03 DIAGNOSIS — R7309 Other abnormal glucose: Secondary | ICD-10-CM | POA: Diagnosis not present

## 2019-11-03 DIAGNOSIS — E785 Hyperlipidemia, unspecified: Secondary | ICD-10-CM | POA: Diagnosis not present

## 2019-11-03 DIAGNOSIS — N529 Male erectile dysfunction, unspecified: Secondary | ICD-10-CM | POA: Diagnosis not present

## 2019-11-03 DIAGNOSIS — I10 Essential (primary) hypertension: Secondary | ICD-10-CM | POA: Diagnosis not present

## 2019-11-03 DIAGNOSIS — E663 Overweight: Secondary | ICD-10-CM | POA: Diagnosis not present

## 2019-11-03 DIAGNOSIS — N1832 Chronic kidney disease, stage 3b: Secondary | ICD-10-CM | POA: Diagnosis not present

## 2019-11-18 DIAGNOSIS — E113312 Type 2 diabetes mellitus with moderate nonproliferative diabetic retinopathy with macular edema, left eye: Secondary | ICD-10-CM | POA: Diagnosis not present

## 2019-12-16 DIAGNOSIS — E113311 Type 2 diabetes mellitus with moderate nonproliferative diabetic retinopathy with macular edema, right eye: Secondary | ICD-10-CM | POA: Diagnosis not present

## 2019-12-28 DIAGNOSIS — E113312 Type 2 diabetes mellitus with moderate nonproliferative diabetic retinopathy with macular edema, left eye: Secondary | ICD-10-CM | POA: Diagnosis not present

## 2020-02-03 DIAGNOSIS — E113311 Type 2 diabetes mellitus with moderate nonproliferative diabetic retinopathy with macular edema, right eye: Secondary | ICD-10-CM | POA: Diagnosis not present

## 2020-02-04 DIAGNOSIS — Z9111 Patient's noncompliance with dietary regimen: Secondary | ICD-10-CM | POA: Diagnosis not present

## 2020-02-04 DIAGNOSIS — E1165 Type 2 diabetes mellitus with hyperglycemia: Secondary | ICD-10-CM | POA: Diagnosis not present

## 2020-02-04 DIAGNOSIS — E559 Vitamin D deficiency, unspecified: Secondary | ICD-10-CM | POA: Diagnosis not present

## 2020-02-04 DIAGNOSIS — Z7984 Long term (current) use of oral hypoglycemic drugs: Secondary | ICD-10-CM | POA: Diagnosis not present

## 2020-02-04 DIAGNOSIS — N1832 Chronic kidney disease, stage 3b: Secondary | ICD-10-CM | POA: Diagnosis not present

## 2020-02-04 DIAGNOSIS — Z23 Encounter for immunization: Secondary | ICD-10-CM | POA: Diagnosis not present

## 2020-02-04 DIAGNOSIS — Z8546 Personal history of malignant neoplasm of prostate: Secondary | ICD-10-CM | POA: Diagnosis not present

## 2020-02-04 DIAGNOSIS — E785 Hyperlipidemia, unspecified: Secondary | ICD-10-CM | POA: Diagnosis not present

## 2020-02-04 DIAGNOSIS — E1142 Type 2 diabetes mellitus with diabetic polyneuropathy: Secondary | ICD-10-CM | POA: Diagnosis not present

## 2020-02-04 DIAGNOSIS — E11311 Type 2 diabetes mellitus with unspecified diabetic retinopathy with macular edema: Secondary | ICD-10-CM | POA: Diagnosis not present

## 2020-02-16 DIAGNOSIS — E113312 Type 2 diabetes mellitus with moderate nonproliferative diabetic retinopathy with macular edema, left eye: Secondary | ICD-10-CM | POA: Diagnosis not present

## 2020-02-25 DIAGNOSIS — E785 Hyperlipidemia, unspecified: Secondary | ICD-10-CM | POA: Diagnosis not present

## 2020-02-25 DIAGNOSIS — D509 Iron deficiency anemia, unspecified: Secondary | ICD-10-CM | POA: Diagnosis not present

## 2020-02-25 DIAGNOSIS — C61 Malignant neoplasm of prostate: Secondary | ICD-10-CM | POA: Diagnosis not present

## 2020-02-25 DIAGNOSIS — I129 Hypertensive chronic kidney disease with stage 1 through stage 4 chronic kidney disease, or unspecified chronic kidney disease: Secondary | ICD-10-CM | POA: Diagnosis not present

## 2020-02-25 DIAGNOSIS — E1122 Type 2 diabetes mellitus with diabetic chronic kidney disease: Secondary | ICD-10-CM | POA: Diagnosis not present

## 2020-02-25 DIAGNOSIS — N1832 Chronic kidney disease, stage 3b: Secondary | ICD-10-CM | POA: Diagnosis not present

## 2020-03-02 ENCOUNTER — Other Ambulatory Visit: Payer: Self-pay | Admitting: Internal Medicine

## 2020-03-02 DIAGNOSIS — N1832 Chronic kidney disease, stage 3b: Secondary | ICD-10-CM

## 2020-03-10 DIAGNOSIS — N1832 Chronic kidney disease, stage 3b: Secondary | ICD-10-CM | POA: Diagnosis not present

## 2020-03-15 ENCOUNTER — Ambulatory Visit
Admission: RE | Admit: 2020-03-15 | Discharge: 2020-03-15 | Disposition: A | Discharge status: Home / Self Care | Payer: BC Managed Care – PPO | Source: Ambulatory Visit | Attending: Internal Medicine | Admitting: Internal Medicine

## 2020-03-15 DIAGNOSIS — N1832 Chronic kidney disease, stage 3b: Secondary | ICD-10-CM

## 2020-03-15 DIAGNOSIS — N183 Chronic kidney disease, stage 3 unspecified: Secondary | ICD-10-CM | POA: Diagnosis not present

## 2020-03-16 DIAGNOSIS — E113311 Type 2 diabetes mellitus with moderate nonproliferative diabetic retinopathy with macular edema, right eye: Secondary | ICD-10-CM | POA: Diagnosis not present

## 2020-03-21 DIAGNOSIS — E113312 Type 2 diabetes mellitus with moderate nonproliferative diabetic retinopathy with macular edema, left eye: Secondary | ICD-10-CM | POA: Diagnosis not present

## 2020-05-03 DIAGNOSIS — E785 Hyperlipidemia, unspecified: Secondary | ICD-10-CM | POA: Diagnosis not present

## 2020-05-03 DIAGNOSIS — E11311 Type 2 diabetes mellitus with unspecified diabetic retinopathy with macular edema: Secondary | ICD-10-CM | POA: Diagnosis not present

## 2020-05-03 DIAGNOSIS — E1142 Type 2 diabetes mellitus with diabetic polyneuropathy: Secondary | ICD-10-CM | POA: Diagnosis not present

## 2020-05-03 DIAGNOSIS — Z8546 Personal history of malignant neoplasm of prostate: Secondary | ICD-10-CM | POA: Diagnosis not present

## 2020-05-03 DIAGNOSIS — E559 Vitamin D deficiency, unspecified: Secondary | ICD-10-CM | POA: Diagnosis not present

## 2020-05-03 DIAGNOSIS — I10 Essential (primary) hypertension: Secondary | ICD-10-CM | POA: Diagnosis not present

## 2020-05-03 DIAGNOSIS — N1832 Chronic kidney disease, stage 3b: Secondary | ICD-10-CM | POA: Diagnosis not present

## 2020-05-03 DIAGNOSIS — E1165 Type 2 diabetes mellitus with hyperglycemia: Secondary | ICD-10-CM | POA: Diagnosis not present

## 2020-05-03 DIAGNOSIS — N529 Male erectile dysfunction, unspecified: Secondary | ICD-10-CM | POA: Diagnosis not present

## 2020-05-04 DIAGNOSIS — E113312 Type 2 diabetes mellitus with moderate nonproliferative diabetic retinopathy with macular edema, left eye: Secondary | ICD-10-CM | POA: Diagnosis not present

## 2020-05-17 DIAGNOSIS — I129 Hypertensive chronic kidney disease with stage 1 through stage 4 chronic kidney disease, or unspecified chronic kidney disease: Secondary | ICD-10-CM | POA: Diagnosis not present

## 2020-05-17 DIAGNOSIS — N1832 Chronic kidney disease, stage 3b: Secondary | ICD-10-CM | POA: Diagnosis not present

## 2020-05-17 DIAGNOSIS — E785 Hyperlipidemia, unspecified: Secondary | ICD-10-CM | POA: Diagnosis not present

## 2020-05-17 DIAGNOSIS — E1122 Type 2 diabetes mellitus with diabetic chronic kidney disease: Secondary | ICD-10-CM | POA: Diagnosis not present

## 2020-05-18 DIAGNOSIS — E113311 Type 2 diabetes mellitus with moderate nonproliferative diabetic retinopathy with macular edema, right eye: Secondary | ICD-10-CM | POA: Diagnosis not present

## 2020-06-02 DIAGNOSIS — N1832 Chronic kidney disease, stage 3b: Secondary | ICD-10-CM | POA: Diagnosis not present

## 2020-07-06 DIAGNOSIS — E113311 Type 2 diabetes mellitus with moderate nonproliferative diabetic retinopathy with macular edema, right eye: Secondary | ICD-10-CM | POA: Diagnosis not present

## 2020-07-21 DIAGNOSIS — E113312 Type 2 diabetes mellitus with moderate nonproliferative diabetic retinopathy with macular edema, left eye: Secondary | ICD-10-CM | POA: Diagnosis not present

## 2020-08-24 DIAGNOSIS — E113311 Type 2 diabetes mellitus with moderate nonproliferative diabetic retinopathy with macular edema, right eye: Secondary | ICD-10-CM | POA: Diagnosis not present

## 2020-08-31 DIAGNOSIS — E113312 Type 2 diabetes mellitus with moderate nonproliferative diabetic retinopathy with macular edema, left eye: Secondary | ICD-10-CM | POA: Diagnosis not present

## 2020-10-12 DIAGNOSIS — E113311 Type 2 diabetes mellitus with moderate nonproliferative diabetic retinopathy with macular edema, right eye: Secondary | ICD-10-CM | POA: Diagnosis not present

## 2020-10-19 DIAGNOSIS — E113312 Type 2 diabetes mellitus with moderate nonproliferative diabetic retinopathy with macular edema, left eye: Secondary | ICD-10-CM | POA: Diagnosis not present

## 2020-10-20 DIAGNOSIS — E559 Vitamin D deficiency, unspecified: Secondary | ICD-10-CM | POA: Diagnosis not present

## 2020-10-20 DIAGNOSIS — E1165 Type 2 diabetes mellitus with hyperglycemia: Secondary | ICD-10-CM | POA: Diagnosis not present

## 2020-10-20 DIAGNOSIS — E11311 Type 2 diabetes mellitus with unspecified diabetic retinopathy with macular edema: Secondary | ICD-10-CM | POA: Diagnosis not present

## 2020-10-20 DIAGNOSIS — I10 Essential (primary) hypertension: Secondary | ICD-10-CM | POA: Diagnosis not present

## 2020-10-20 DIAGNOSIS — E785 Hyperlipidemia, unspecified: Secondary | ICD-10-CM | POA: Diagnosis not present

## 2020-10-20 DIAGNOSIS — Z0000 Encounter for general adult medical examination without abnormal findings: Secondary | ICD-10-CM | POA: Diagnosis not present

## 2020-10-20 DIAGNOSIS — N1832 Chronic kidney disease, stage 3b: Secondary | ICD-10-CM | POA: Diagnosis not present

## 2020-10-20 DIAGNOSIS — E663 Overweight: Secondary | ICD-10-CM | POA: Diagnosis not present

## 2020-10-20 DIAGNOSIS — E1142 Type 2 diabetes mellitus with diabetic polyneuropathy: Secondary | ICD-10-CM | POA: Diagnosis not present

## 2020-10-20 DIAGNOSIS — N529 Male erectile dysfunction, unspecified: Secondary | ICD-10-CM | POA: Diagnosis not present

## 2020-10-20 DIAGNOSIS — Z79899 Other long term (current) drug therapy: Secondary | ICD-10-CM | POA: Diagnosis not present

## 2020-10-20 DIAGNOSIS — Z8546 Personal history of malignant neoplasm of prostate: Secondary | ICD-10-CM | POA: Diagnosis not present

## 2020-11-17 DIAGNOSIS — E1142 Type 2 diabetes mellitus with diabetic polyneuropathy: Secondary | ICD-10-CM | POA: Diagnosis not present

## 2020-11-17 DIAGNOSIS — E875 Hyperkalemia: Secondary | ICD-10-CM | POA: Diagnosis not present

## 2020-11-17 DIAGNOSIS — I10 Essential (primary) hypertension: Secondary | ICD-10-CM | POA: Diagnosis not present

## 2020-11-17 DIAGNOSIS — Z7984 Long term (current) use of oral hypoglycemic drugs: Secondary | ICD-10-CM | POA: Diagnosis not present

## 2020-11-23 DIAGNOSIS — N1832 Chronic kidney disease, stage 3b: Secondary | ICD-10-CM | POA: Diagnosis not present

## 2020-11-23 DIAGNOSIS — E113311 Type 2 diabetes mellitus with moderate nonproliferative diabetic retinopathy with macular edema, right eye: Secondary | ICD-10-CM | POA: Diagnosis not present

## 2020-11-28 DIAGNOSIS — N1832 Chronic kidney disease, stage 3b: Secondary | ICD-10-CM | POA: Diagnosis not present

## 2020-11-28 DIAGNOSIS — I129 Hypertensive chronic kidney disease with stage 1 through stage 4 chronic kidney disease, or unspecified chronic kidney disease: Secondary | ICD-10-CM | POA: Diagnosis not present

## 2020-11-28 DIAGNOSIS — E875 Hyperkalemia: Secondary | ICD-10-CM | POA: Diagnosis not present

## 2020-11-28 DIAGNOSIS — E1122 Type 2 diabetes mellitus with diabetic chronic kidney disease: Secondary | ICD-10-CM | POA: Diagnosis not present

## 2020-11-28 DIAGNOSIS — E785 Hyperlipidemia, unspecified: Secondary | ICD-10-CM | POA: Diagnosis not present

## 2020-11-29 DIAGNOSIS — E113312 Type 2 diabetes mellitus with moderate nonproliferative diabetic retinopathy with macular edema, left eye: Secondary | ICD-10-CM | POA: Diagnosis not present

## 2020-12-12 DIAGNOSIS — N1832 Chronic kidney disease, stage 3b: Secondary | ICD-10-CM | POA: Diagnosis not present

## 2020-12-14 DIAGNOSIS — E113313 Type 2 diabetes mellitus with moderate nonproliferative diabetic retinopathy with macular edema, bilateral: Secondary | ICD-10-CM | POA: Diagnosis not present

## 2020-12-14 DIAGNOSIS — H35372 Puckering of macula, left eye: Secondary | ICD-10-CM | POA: Diagnosis not present

## 2020-12-14 DIAGNOSIS — H25813 Combined forms of age-related cataract, bilateral: Secondary | ICD-10-CM | POA: Diagnosis not present

## 2020-12-14 DIAGNOSIS — H3582 Retinal ischemia: Secondary | ICD-10-CM | POA: Diagnosis not present

## 2020-12-14 DIAGNOSIS — H35033 Hypertensive retinopathy, bilateral: Secondary | ICD-10-CM | POA: Diagnosis not present

## 2020-12-26 ENCOUNTER — Other Ambulatory Visit: Payer: Self-pay

## 2020-12-26 ENCOUNTER — Encounter (HOSPITAL_COMMUNITY): Payer: Self-pay | Admitting: Ophthalmology

## 2020-12-26 NOTE — Progress Notes (Addendum)
Mr. Anthony Moyer denies chest pain or shortness of breath.Patient denies having any s/s of Covid in his household.  Patient denies any known exposure to Covid.   Mr. Anthony Moyer has type II diabetes, Patient reports that last A1C was 8 something.  I instructed patient to take 1/2 of Treshiba in am - 40 units, if CBG is greater than 70.  Mr. Anthony Moyer does not  check CBGs.  I asked patient to have a snack tonight with protein prior to going to bed. I instructed patient to not take Metformin in am.  PCP is Dr . Lonn [place].  Mr. Anthony Moyer sees Dr. Osborne Casco with [facility].  I instructed patient to shower with antibiotic soap, if it is available.  Dry off with a clean towel. Do not put lotion, powder, cologne or deodorant or makeup.No jewelry or piercings. Men may shave their face and neck. Woman should not shave. No nail polish, artificial or acrylic nails. Wear clean clothes, brush your teeth. Glasses, contact lens,dentures or partials may not be worn in the OR. If you need to wear them, please bring a case for glasses, do not wear contacts or bring a case, the hospital does not have contact cases, dentures or partials will have to be removed , make sure they are clean, we will provide a denture cup to put them in. You will need some one to drive you home and a responsible person over the age of 74 to stay with you for the first 24 hours after surgery.

## 2020-12-27 ENCOUNTER — Encounter (HOSPITAL_COMMUNITY)
Admission: RE | Disposition: A | Discharge status: Home / Self Care | Payer: Self-pay | Source: Home / Self Care | Attending: Ophthalmology

## 2020-12-27 ENCOUNTER — Encounter (HOSPITAL_COMMUNITY): Payer: Self-pay | Admitting: Ophthalmology

## 2020-12-27 ENCOUNTER — Ambulatory Visit (HOSPITAL_COMMUNITY): Payer: Medicare PPO | Admitting: Certified Registered Nurse Anesthetist

## 2020-12-27 ENCOUNTER — Other Ambulatory Visit: Payer: Self-pay

## 2020-12-27 ENCOUNTER — Ambulatory Visit (HOSPITAL_COMMUNITY)
Admission: RE | Admit: 2020-12-27 | Discharge: 2020-12-27 | Disposition: A | Discharge status: Home / Self Care | Payer: Medicare PPO | Attending: Ophthalmology | Admitting: Ophthalmology

## 2020-12-27 DIAGNOSIS — Z794 Long term (current) use of insulin: Secondary | ICD-10-CM | POA: Insufficient documentation

## 2020-12-27 DIAGNOSIS — H35372 Puckering of macula, left eye: Secondary | ICD-10-CM | POA: Diagnosis not present

## 2020-12-27 DIAGNOSIS — I10 Essential (primary) hypertension: Secondary | ICD-10-CM | POA: Diagnosis not present

## 2020-12-27 DIAGNOSIS — E11319 Type 2 diabetes mellitus with unspecified diabetic retinopathy without macular edema: Secondary | ICD-10-CM | POA: Insufficient documentation

## 2020-12-27 DIAGNOSIS — Z7984 Long term (current) use of oral hypoglycemic drugs: Secondary | ICD-10-CM | POA: Insufficient documentation

## 2020-12-27 DIAGNOSIS — E119 Type 2 diabetes mellitus without complications: Secondary | ICD-10-CM | POA: Diagnosis not present

## 2020-12-27 DIAGNOSIS — E1140 Type 2 diabetes mellitus with diabetic neuropathy, unspecified: Secondary | ICD-10-CM | POA: Diagnosis present

## 2020-12-27 HISTORY — DX: Type 2 diabetes mellitus without complications: E11.9

## 2020-12-27 HISTORY — PX: MEMBRANE PEEL: SHX5967

## 2020-12-27 HISTORY — PX: PARS PLANA VITRECTOMY W/ SCLERAL BUCKLE: SHX2171

## 2020-12-27 LAB — POCT I-STAT, CHEM 8
BUN: 33 mg/dL — ABNORMAL HIGH (ref 8–23)
Calcium, Ion: 1.18 mmol/L (ref 1.15–1.40)
Chloride: 113 mmol/L — ABNORMAL HIGH (ref 98–111)
Creatinine, Ser: 1.7 mg/dL — ABNORMAL HIGH (ref 0.61–1.24)
Glucose, Bld: 91 mg/dL (ref 70–99)
HCT: 39 % (ref 39.0–52.0)
Hemoglobin: 13.3 g/dL (ref 13.0–17.0)
Potassium: 4.9 mmol/L (ref 3.5–5.1)
Sodium: 143 mmol/L (ref 135–145)
TCO2: 22 mmol/L (ref 22–32)

## 2020-12-27 LAB — GLUCOSE, CAPILLARY
Glucose-Capillary: 86 mg/dL (ref 70–99)
Glucose-Capillary: 65 mg/dL — ABNORMAL LOW (ref 70–99)

## 2020-12-27 SURGERY — PARS PLANA VITRECTOMY WITH LASER FOR MACULAR HOLE
Anesthesia: Monitor Anesthesia Care | Site: Eye | Laterality: Left

## 2020-12-27 MED ORDER — LACTATED RINGERS IV SOLN: INTRAVENOUS | Status: DC

## 2020-12-27 MED ORDER — ATROPINE SULFATE 1 % OP SOLN
OPHTHALMIC | Status: AC
  Filled 2020-12-27: qty 5

## 2020-12-27 MED ORDER — EPINEPHRINE PF 1 MG/ML IJ SOLN
INTRAOCULAR | Status: DC | PRN
  Administered 2020-12-27: 13:00:00 500 mL

## 2020-12-27 MED ORDER — ACETAMINOPHEN 500 MG PO TABS
1000.0000 mg | ORAL_TABLET | Freq: Once | ORAL | Status: AC
  Administered 2020-12-27: 11:00:00 1000 mg via ORAL
  Filled 2020-12-27: qty 2

## 2020-12-27 MED ORDER — SODIUM CHLORIDE 0.9 % IV SOLN: INTRAVENOUS | Status: DC

## 2020-12-27 MED ORDER — SODIUM HYALURONATE 10 MG/ML IO SOLUTION
PREFILLED_SYRINGE | INTRAOCULAR | Status: AC
  Filled 2020-12-27: qty 0.85

## 2020-12-27 MED ORDER — BRILLIANT BLUE G 0.025 % IO SOSY
PREFILLED_SYRINGE | INTRAOCULAR | Status: DC | PRN
  Administered 2020-12-27: 0.5 mL via INTRAVITREAL

## 2020-12-27 MED ORDER — LIDOCAINE HCL 2 % IJ SOLN
INTRAMUSCULAR | Status: AC
  Filled 2020-12-27: qty 20

## 2020-12-27 MED ORDER — BSS IO SOLN
INTRAOCULAR | Status: DC | PRN
  Administered 2020-12-27: 15 mL via INTRAOCULAR

## 2020-12-27 MED ORDER — PHENYLEPHRINE HCL 2.5 % OP SOLN
1.0000 [drp] | OPHTHALMIC | Status: AC | PRN
  Administered 2020-12-27 (×3): 1 [drp] via OPHTHALMIC
  Filled 2020-12-27: qty 2

## 2020-12-27 MED ORDER — DEXAMETHASONE SODIUM PHOSPHATE 10 MG/ML IJ SOLN
INTRAMUSCULAR | Status: DC | PRN
  Administered 2020-12-27: 10 mg

## 2020-12-27 MED ORDER — PHENYLEPHRINE HCL 10 % OP SOLN
1.0000 [drp] | Freq: Once | OPHTHALMIC | Status: DC
  Filled 2020-12-27: qty 5

## 2020-12-27 MED ORDER — PHENYLEPHRINE HCL 10 % OP SOLN
OPHTHALMIC | Status: DC | PRN
  Administered 2020-12-27: 1 [drp] via OPHTHALMIC

## 2020-12-27 MED ORDER — CEFAZOLIN SUBCONJUNCTIVAL INJECTION 100 MG/0.5 ML
INJECTION | SUBCONJUNCTIVAL | Status: DC | PRN
  Administered 2020-12-27: 100 mg via SUBCONJUNCTIVAL

## 2020-12-27 MED ORDER — PROPARACAINE HCL 0.5 % OP SOLN
1.0000 [drp] | OPHTHALMIC | Status: AC | PRN
  Administered 2020-12-27 (×3): 1 [drp] via OPHTHALMIC
  Filled 2020-12-27: qty 15

## 2020-12-27 MED ORDER — LACTATED RINGERS IV SOLN: INTRAVENOUS | Status: DC | PRN

## 2020-12-27 MED ORDER — OFLOXACIN 0.3 % OP SOLN
1.0000 [drp] | OPHTHALMIC | Status: AC | PRN
  Administered 2020-12-27 (×3): 1 [drp] via OPHTHALMIC
  Filled 2020-12-27: qty 5

## 2020-12-27 MED ORDER — BUPIVACAINE HCL (PF) 0.75 % IJ SOLN
INTRAMUSCULAR | Status: AC
  Filled 2020-12-27: qty 10

## 2020-12-27 MED ORDER — SODIUM HYALURONATE 10 MG/ML IO SOLUTION
PREFILLED_SYRINGE | INTRAOCULAR | Status: DC | PRN
  Administered 2020-12-27: 0.85 mL via INTRAOCULAR

## 2020-12-27 MED ORDER — BACITRACIN-POLYMYXIN B 500-10000 UNIT/GM OP OINT
TOPICAL_OINTMENT | OPHTHALMIC | Status: AC
  Filled 2020-12-27: qty 3.5

## 2020-12-27 MED ORDER — TROPICAMIDE 1 % OP SOLN
1.0000 [drp] | OPHTHALMIC | Status: AC | PRN
  Administered 2020-12-27 (×3): 1 [drp] via OPHTHALMIC
  Filled 2020-12-27: qty 15

## 2020-12-27 MED ORDER — LIDOCAINE HCL 2 % IJ SOLN
INTRAMUSCULAR | Status: DC | PRN
  Administered 2020-12-27: 14:00:00 5 mL via RETROBULBAR

## 2020-12-27 MED ORDER — ONDANSETRON HCL 4 MG/2ML IJ SOLN
INTRAMUSCULAR | Status: DC | PRN
  Administered 2020-12-27: 4 mg via INTRAVENOUS

## 2020-12-27 MED ORDER — PROPOFOL 10 MG/ML IV BOLUS
INTRAVENOUS | Status: DC | PRN
  Administered 2020-12-27: 50 mg via INTRAVENOUS

## 2020-12-27 MED ORDER — TETRACAINE HCL 0.5 % OP SOLN
OPHTHALMIC | Status: AC
  Filled 2020-12-27: qty 4

## 2020-12-27 MED ORDER — BSS PLUS IO SOLN
INTRAOCULAR | Status: AC
  Filled 2020-12-27: qty 500

## 2020-12-27 MED ORDER — BSS IO SOLN
INTRAOCULAR | Status: AC
  Filled 2020-12-27: qty 15

## 2020-12-27 MED ORDER — CEFAZOLIN SUBCONJUNCTIVAL INJECTION 100 MG/0.5 ML
100.0000 mg | INJECTION | SUBCONJUNCTIVAL | Status: DC
  Filled 2020-12-27: qty 5

## 2020-12-27 MED ORDER — ORAL CARE MOUTH RINSE: 15.0000 mL | Freq: Once | OROMUCOSAL | Status: AC

## 2020-12-27 MED ORDER — EPINEPHRINE PF 1 MG/ML IJ SOLN
INTRAMUSCULAR | Status: AC
  Filled 2020-12-27: qty 1

## 2020-12-27 MED ORDER — 0.9 % SODIUM CHLORIDE (POUR BTL) OPTIME
TOPICAL | Status: DC | PRN
  Administered 2020-12-27: 13:00:00 1000 mL

## 2020-12-27 MED ORDER — CHLORHEXIDINE GLUCONATE 0.12 % MT SOLN
15.0000 mL | Freq: Once | OROMUCOSAL | Status: AC
  Administered 2020-12-27: 11:00:00 15 mL via OROMUCOSAL
  Filled 2020-12-27: qty 15

## 2020-12-27 MED ORDER — ACETAZOLAMIDE SODIUM 500 MG IJ SOLR
INTRAMUSCULAR | Status: AC
  Filled 2020-12-27: qty 500

## 2020-12-27 MED ORDER — HYALURONIDASE HUMAN 150 UNIT/ML IJ SOLN
INTRAMUSCULAR | Status: AC
  Filled 2020-12-27: qty 1

## 2020-12-27 MED ORDER — TRIAMCINOLONE ACETONIDE 40 MG/ML IJ SUSP
INTRAMUSCULAR | Status: AC
  Filled 2020-12-27: qty 5

## 2020-12-27 MED ORDER — DEXAMETHASONE SODIUM PHOSPHATE 10 MG/ML IJ SOLN
INTRAMUSCULAR | Status: AC
  Filled 2020-12-27: qty 1

## 2020-12-27 SURGICAL SUPPLY — 79 items
APPLICATOR COTTON TIP 6 STRL (MISCELLANEOUS) ×1 IMPLANT
APPLICATOR COTTON TIP 6IN STRL (MISCELLANEOUS) ×3 IMPLANT
APPLICATOR DR MATTHEWS STRL (MISCELLANEOUS) IMPLANT
BAG COUNTER SPONGE SURGICOUNT (BAG) ×2 IMPLANT
BAG SURGICOUNT SPONGE COUNTING (BAG) ×1
BAND WRIST GAS GREEN (MISCELLANEOUS) IMPLANT
BLADE EYE CATARACT 19 1.4 BEAV (BLADE) IMPLANT
BLADE MVR KNIFE 19G (BLADE) IMPLANT
BNDG EYE OVAL (GAUZE/BANDAGES/DRESSINGS) IMPLANT
CANNULA ANT CHAM MAIN (OPHTHALMIC RELATED) IMPLANT
CANNULA DUAL BORE 23G (CANNULA) IMPLANT
CANNULA VLV SOFT TIP 25G (OPHTHALMIC) ×1 IMPLANT
CANNULA VLV SOFT TIP 25GA (OPHTHALMIC) ×3 IMPLANT
COTTONBALL LRG STERILE PKG (GAUZE/BANDAGES/DRESSINGS) ×9 IMPLANT
COVER SURGICAL LIGHT HANDLE (MISCELLANEOUS) ×3 IMPLANT
DRAPE INCISE 51X51 W/FILM STRL (DRAPES) ×3 IMPLANT
DRAPE RETRACTOR (MISCELLANEOUS) ×3 IMPLANT
ERASER HMR WETFIELD 23G BP (MISCELLANEOUS) IMPLANT
FILTER BLUE MILLIPORE (MISCELLANEOUS) IMPLANT
FILTER STRAW FLUID ASPIR (MISCELLANEOUS) IMPLANT
FORCEPS GRIESHABER ILM 25G A (INSTRUMENTS) ×2 IMPLANT
GAS AUTO FILL CONSTEL (OPHTHALMIC)
GAS AUTO FILL CONSTELLATION (OPHTHALMIC) IMPLANT
GAS WRIST BAND GREEN (MISCELLANEOUS)
GLOVE SURG SYN 7.5  E (GLOVE) ×3
GLOVE SURG SYN 7.5 E (GLOVE) ×1 IMPLANT
GLOVE SURG SYN 7.5 PF PI (GLOVE) ×1 IMPLANT
GOWN STRL REUS W/ TWL LRG LVL3 (GOWN DISPOSABLE) ×2 IMPLANT
GOWN STRL REUS W/TWL LRG LVL3 (GOWN DISPOSABLE) ×6
KIT BASIN OR (CUSTOM PROCEDURE TRAY) ×3 IMPLANT
KIT PERFLUORON PROCEDURE 5ML (MISCELLANEOUS) IMPLANT
KIT TURNOVER KIT B (KITS) IMPLANT
KNIFE GRIESHABER SHARP 2.5MM (MISCELLANEOUS) ×3 IMPLANT
LENS BIOM SUPER VIEW SET DISP (MISCELLANEOUS) ×3 IMPLANT
MICROPICK 25G (MISCELLANEOUS)
NDL 18GX1X1/2 (RX/OR ONLY) (NEEDLE) ×1 IMPLANT
NDL 25GX 5/8IN NON SAFETY (NEEDLE) ×1 IMPLANT
NDL 27GX1/2 REG BEVEL ECLIP (NEEDLE) ×1 IMPLANT
NDL FILTER BLUNT 18X1 1/2 (NEEDLE) ×1 IMPLANT
NDL HYPO 25GX1X1/2 BEV (NEEDLE) ×1 IMPLANT
NDL HYPO 30X.5 LL (NEEDLE) ×1 IMPLANT
NDL RETROBULBAR 25GX1.5 (NEEDLE) ×1 IMPLANT
NEEDLE 18GX1X1/2 (RX/OR ONLY) (NEEDLE) ×3 IMPLANT
NEEDLE 25GX 5/8IN NON SAFETY (NEEDLE) ×3 IMPLANT
NEEDLE 27GX1/2 REG BEVEL ECLIP (NEEDLE) ×3 IMPLANT
NEEDLE FILTER BLUNT 18X 1/2SAF (NEEDLE) ×2
NEEDLE FILTER BLUNT 18X1 1/2 (NEEDLE) ×1 IMPLANT
NEEDLE HYPO 25GX1X1/2 BEV (NEEDLE) ×3 IMPLANT
NEEDLE HYPO 30X.5 LL (NEEDLE) ×3 IMPLANT
NEEDLE RETROBULBAR 25GX1.5 (NEEDLE) ×3 IMPLANT
NS IRRIG 1000ML POUR BTL (IV SOLUTION) ×3 IMPLANT
PACK VITRECTOMY CUSTOM (CUSTOM PROCEDURE TRAY) ×3 IMPLANT
PAD ARMBOARD 7.5X6 YLW CONV (MISCELLANEOUS) ×6 IMPLANT
PAK PIK VITRECTOMY CVS 25GA (OPHTHALMIC) ×3 IMPLANT
PICK MICROPICK 25G (MISCELLANEOUS) IMPLANT
PROBE LASER ILLUM FLEX CVD 25G (OPHTHALMIC) IMPLANT
REPL STRA BRUSH NDL (NEEDLE) IMPLANT
REPL STRA BRUSH NEEDLE (NEEDLE) IMPLANT
RESERVOIR BACK FLUSH (MISCELLANEOUS) IMPLANT
ROLLS DENTAL (MISCELLANEOUS) ×6 IMPLANT
SCRAPER DIAMOND 25GA (OPHTHALMIC RELATED) ×2 IMPLANT
SHEET MEDIUM DRAPE 40X70 STRL (DRAPES) ×3 IMPLANT
SOL ANTI FOG 6CC (MISCELLANEOUS) ×1 IMPLANT
SOLUTION ANTI FOG 6CC (MISCELLANEOUS) ×2
STOPCOCK 4 WAY LG BORE MALE ST (IV SETS) IMPLANT
SUT CHROMIC 7 0 TG140 8 (SUTURE) IMPLANT
SUT ETHILON 9 0 TG140 8 (SUTURE) IMPLANT
SUT MERSILENE 4 0 RV 2 (SUTURE) IMPLANT
SUT SILK 2 0 (SUTURE)
SUT SILK 2-0 18XBRD TIE 12 (SUTURE) IMPLANT
SUT SILK 4 0 RB 1 (SUTURE) IMPLANT
SYR 10ML LL (SYRINGE) ×3 IMPLANT
SYR 20ML LL LF (SYRINGE) IMPLANT
SYR 5ML LL (SYRINGE) ×3 IMPLANT
SYR BULB EAR ULCER 3OZ GRN STR (SYRINGE) ×3 IMPLANT
SYR TB 1ML LUER SLIP (SYRINGE) IMPLANT
TUBING HIGH PRESS EXTEN 6IN (TUBING) IMPLANT
WATER STERILE IRR 1000ML POUR (IV SOLUTION) ×3 IMPLANT
WIPE INSTRUMENT VISIWIPE 73X73 (MISCELLANEOUS) IMPLANT

## 2020-12-27 NOTE — Discharge Instructions (Addendum)
DO NOT SLEEP ON BACK, THE EYE PRESSURE CAN GO UP AND CAUSE VISION LOSS   SLEEP ON SIDE WITH NOSE TO PILLOW  DURING DAY KEEP UPRIGHT 

## 2020-12-27 NOTE — H&P (Deleted)
Date of examination:  12/27/20  Indication for surgery: macular pucker left eye  Pertinent past medical history:  Past Medical History:  Diagnosis Date   Diabetes mellitus without complication ([place])    Hypercholesterolemia    Hypertension    Prostate cancer ([place])    Skin cancer    melonoma- on back    Pertinent ocular history:  none  Pertinent family history:  Family History  Problem Relation Age of Onset   Diabetes Mother    Diabetes Father    Heart failure Father    Cancer Father        melanoma    General:  Healthy appearing patient in no distress.    Eyes:    Acuity OS 20/30   External: Within normal limits     Anterior segment: Within normal limits     Fundus: macular pucker left eye    Impression: macular pucker left eye  Plan: macular pucker repair left eye  Jalene Mullet, MD

## 2020-12-27 NOTE — Transfer of Care (Signed)
Immediate Anesthesia Transfer of Care Note  Patient: Anthony Moyer  Procedure(s) Performed: 25 GAUGE PARS PLANA VITRECTOMY FOR MACULAR PUCKER LEFT EYE (Left: Eye) MEMBRANE PEEL (Left: Eye)  Patient Location: PACU  Anesthesia Type:MAC  Level of Consciousness: awake, alert  and oriented  Airway & Oxygen Therapy: Patient Spontanous Breathing  Post-op Assessment: Report given to RN and Post -op Vital signs reviewed and stable  Post vital signs: Reviewed and stable  Last Vitals:  Vitals Value Taken Time  BP 158/67 12/27/20 1432  Temp 36.9 C 12/27/20 1432  Pulse 75 12/27/20 1438  Resp 18 12/27/20 1438  SpO2 97 % 12/27/20 1438  Vitals shown include unvalidated device data.  Last Pain:  Vitals:   12/27/20 1432  TempSrc:   PainSc: 0-No pain         Complications: No notable events documented.

## 2020-12-27 NOTE — H&P (Signed)
°  Date of examination:  12/27/20   Indication for surgery: macular pucker left eye   Pertinent past medical history:      Past Medical History:  Diagnosis Date   Diabetes mellitus without complication ([place])     Hypercholesterolemia     Hypertension     Prostate cancer ([place])     Skin cancer      melonoma- on back      Pertinent ocular history:  none   Pertinent family history:       Family History  Problem Relation Age of Onset   Diabetes Mother     Diabetes Father     Heart failure Father     Cancer Father          melanoma      General:  Healthy appearing patient in no distress.     Eyes:               Acuity OS 20/30              External: Within normal limits                Anterior segment: Within normal limits                Fundus: macular pucker left eye                Impression: macular pucker left eye   Plan: macular pucker repair left eye  Jalene Mullet, MD

## 2020-12-27 NOTE — Anesthesia Postprocedure Evaluation (Signed)
Anesthesia Post Note  Patient: Anthony Moyer  Procedure(s) Performed: 25 GAUGE PARS PLANA VITRECTOMY FOR MACULAR PUCKER LEFT EYE (Left: Eye) MEMBRANE PEEL (Left: Eye)     Patient location during evaluation: PACU Anesthesia Type: MAC Level of consciousness: awake and alert Pain management: pain level controlled Vital Signs Assessment: post-procedure vital signs reviewed and stable Respiratory status: spontaneous breathing, nonlabored ventilation and respiratory function stable Cardiovascular status: blood pressure returned to baseline and stable Postop Assessment: no apparent nausea or vomiting Anesthetic complications: no   No notable events documented.  Last Vitals:  Vitals:   12/27/20 1502 12/27/20 1512  BP: (!) 150/64 (!) 147/70  Pulse: 72 75  Resp: 13 13  Temp:  36.5 C  SpO2: 98% 98%    Last Pain:  Vitals:   12/27/20 1512  TempSrc:   PainSc: 0-No pain                 Samaia Iwata,W. EDMOND

## 2020-12-27 NOTE — Op Note (Signed)
Anthony Moyer 12/27/2020 Diagnosis: macular pucker left eye  Procedure: Pars Plana Vitrectomy, Membrane Peeling, and air/fluid exchange left eye Operative Eye:  left eye  Surgeon: Royston Cowper Estimated Blood Loss: minimal Specimens for Pathology:  None Complications: none   The  patient was prepped and draped in the usual fashion for ocular surgery on the  left eye .  A lid speculum was placed.  Infusion line and trocar was placed at the 4 o'clock position approximately 3.5 mm from the surgical limbus.   The infusion line was allowed to run and then clamped when placed at the cannula opening. The line was inserted and secured to the drape with an adhesive strip.   Active trocars/cannula were placed at the 10 and 2 o'clock positions approximately 3.5 mm from the surgical limbus. The cannula was visualized in the vitreous cavity.  The light pipe and vitreous cutter were inserted into the vitreous cavity and a core vitrectomy was performed.  Care taken to remove the vitreous up to the vitreous base for 360 degrees.   Membrane blue dye was placed over the macula and irrigated free after 60 seconds.  The Tano scratcher was used to elevate the internal limiting membrane (ILM) and the epiretinal membrane (ERM).  The ILM forceps were used to remove the ILM/ERM complex.    Scleral depressed examination confirmed no peripheral breaks or detachment.   A partial air-fluid exchange was performed.   The superior cannulas were sequentially removed with concommitant tamponade using a cotton tipped applicator and noted to be air tight.  The infusion line and trocar were removed and the sclerotomy was noted to be air tight with normal intraocular pressure by digital palpapation.  Subconjunctival injections of Ancef and Dexamethasone 4mg /53ml were placed in the infero-medial quadrant.    The speculum and drapes were removed and the eye was patched with Polymixin/Bacitracin ophthalmic ointment. An eye  shield was placed and the patient was transferred alert and conversant with stable vital signs to the post operative recovery area.  The patient tolerated the procedure well and no complications were noted.   Royston Cowper MD

## 2020-12-27 NOTE — Anesthesia Preprocedure Evaluation (Addendum)
Anesthesia Evaluation  Patient identified by MRN, date of birth, ID band Patient awake    Reviewed: Allergy & Precautions, H&P , NPO status , Patient's Chart, lab work & pertinent test results  Airway Mallampati: III  TM Distance: >3 FB Neck ROM: Full    Dental no notable dental hx. (+) Teeth Intact, Dental Advisory Given   Pulmonary neg pulmonary ROS,    Pulmonary exam normal breath sounds clear to auscultation       Cardiovascular hypertension, Pt. on medications  Rhythm:Regular Rate:Normal     Neuro/Psych negative neurological ROS  negative psych ROS   GI/Hepatic negative GI ROS, Neg liver ROS,   Endo/Other  diabetes, Oral Hypoglycemic Agents, Insulin Dependent  Renal/GU negative Renal ROS  negative genitourinary   Musculoskeletal   Abdominal   Peds  Hematology negative hematology ROS (+)   Anesthesia Other Findings   Reproductive/Obstetrics negative OB ROS                            Anesthesia Physical Anesthesia Plan  ASA: 3  Anesthesia Plan: MAC   Post-op Pain Management: Tylenol PO (pre-op) and Minimal or no pain anticipated   Induction: Intravenous  PONV Risk Score and Plan: 2 and Propofol infusion and Ondansetron  Airway Management Planned: Natural Airway and Nasal Cannula  Additional Equipment:   Intra-op Plan:   Post-operative Plan:   Informed Consent: I have reviewed the patients History and Physical, chart, labs and discussed the procedure including the risks, benefits and alternatives for the proposed anesthesia with the patient or authorized representative who has indicated his/her understanding and acceptance.     Dental advisory given  Plan Discussed with: CRNA  Anesthesia Plan Comments:         Anesthesia Quick Evaluation

## 2020-12-28 ENCOUNTER — Encounter (HOSPITAL_COMMUNITY): Payer: Self-pay | Admitting: Ophthalmology

## 2020-12-28 DIAGNOSIS — H35372 Puckering of macula, left eye: Secondary | ICD-10-CM | POA: Diagnosis not present

## 2021-01-04 DIAGNOSIS — H35372 Puckering of macula, left eye: Secondary | ICD-10-CM | POA: Diagnosis not present

## 2021-02-07 DIAGNOSIS — H35372 Puckering of macula, left eye: Secondary | ICD-10-CM | POA: Diagnosis not present

## 2021-02-14 DIAGNOSIS — E113312 Type 2 diabetes mellitus with moderate nonproliferative diabetic retinopathy with macular edema, left eye: Secondary | ICD-10-CM | POA: Diagnosis not present

## 2021-02-20 DIAGNOSIS — N1832 Chronic kidney disease, stage 3b: Secondary | ICD-10-CM | POA: Diagnosis not present

## 2021-02-20 DIAGNOSIS — E113311 Type 2 diabetes mellitus with moderate nonproliferative diabetic retinopathy with macular edema, right eye: Secondary | ICD-10-CM | POA: Diagnosis not present

## 2021-02-28 DIAGNOSIS — E785 Hyperlipidemia, unspecified: Secondary | ICD-10-CM | POA: Diagnosis not present

## 2021-02-28 DIAGNOSIS — N1832 Chronic kidney disease, stage 3b: Secondary | ICD-10-CM | POA: Diagnosis not present

## 2021-02-28 DIAGNOSIS — I129 Hypertensive chronic kidney disease with stage 1 through stage 4 chronic kidney disease, or unspecified chronic kidney disease: Secondary | ICD-10-CM | POA: Diagnosis not present

## 2021-02-28 DIAGNOSIS — E1122 Type 2 diabetes mellitus with diabetic chronic kidney disease: Secondary | ICD-10-CM | POA: Diagnosis not present

## 2021-03-20 DIAGNOSIS — E113311 Type 2 diabetes mellitus with moderate nonproliferative diabetic retinopathy with macular edema, right eye: Secondary | ICD-10-CM | POA: Diagnosis not present

## 2021-03-20 DIAGNOSIS — H52202 Unspecified astigmatism, left eye: Secondary | ICD-10-CM | POA: Diagnosis not present

## 2021-03-20 DIAGNOSIS — H2513 Age-related nuclear cataract, bilateral: Secondary | ICD-10-CM | POA: Diagnosis not present

## 2021-04-04 DIAGNOSIS — E113312 Type 2 diabetes mellitus with moderate nonproliferative diabetic retinopathy with macular edema, left eye: Secondary | ICD-10-CM | POA: Diagnosis not present

## 2021-04-25 DIAGNOSIS — E113311 Type 2 diabetes mellitus with moderate nonproliferative diabetic retinopathy with macular edema, right eye: Secondary | ICD-10-CM | POA: Diagnosis not present

## 2021-04-28 DIAGNOSIS — E875 Hyperkalemia: Secondary | ICD-10-CM | POA: Diagnosis not present

## 2021-04-28 DIAGNOSIS — I10 Essential (primary) hypertension: Secondary | ICD-10-CM | POA: Diagnosis not present

## 2021-04-28 DIAGNOSIS — E1142 Type 2 diabetes mellitus with diabetic polyneuropathy: Secondary | ICD-10-CM | POA: Diagnosis not present

## 2021-05-02 DIAGNOSIS — E113312 Type 2 diabetes mellitus with moderate nonproliferative diabetic retinopathy with macular edema, left eye: Secondary | ICD-10-CM | POA: Diagnosis not present

## 2021-05-26 DIAGNOSIS — E1122 Type 2 diabetes mellitus with diabetic chronic kidney disease: Secondary | ICD-10-CM | POA: Diagnosis not present

## 2021-05-26 DIAGNOSIS — N1832 Chronic kidney disease, stage 3b: Secondary | ICD-10-CM | POA: Diagnosis not present

## 2021-06-02 DIAGNOSIS — E785 Hyperlipidemia, unspecified: Secondary | ICD-10-CM | POA: Diagnosis not present

## 2021-06-02 DIAGNOSIS — N1832 Chronic kidney disease, stage 3b: Secondary | ICD-10-CM | POA: Diagnosis not present

## 2021-06-02 DIAGNOSIS — E669 Obesity, unspecified: Secondary | ICD-10-CM | POA: Diagnosis not present

## 2021-06-02 DIAGNOSIS — E1122 Type 2 diabetes mellitus with diabetic chronic kidney disease: Secondary | ICD-10-CM | POA: Diagnosis not present

## 2021-06-02 DIAGNOSIS — E875 Hyperkalemia: Secondary | ICD-10-CM | POA: Diagnosis not present

## 2021-06-02 DIAGNOSIS — I129 Hypertensive chronic kidney disease with stage 1 through stage 4 chronic kidney disease, or unspecified chronic kidney disease: Secondary | ICD-10-CM | POA: Diagnosis not present

## 2021-06-13 IMAGING — US US RENAL
1 series · 14 of 25 positions shown · non-contrast
Comparison: None.

CLINICAL DATA: Stage 3 chronic kidney disease

EXAM:
RENAL / URINARY TRACT ULTRASOUND COMPLETE

[Series 1: us renal · 0.23mm/px · 14 of 28 slices shown]
[im 1/28]
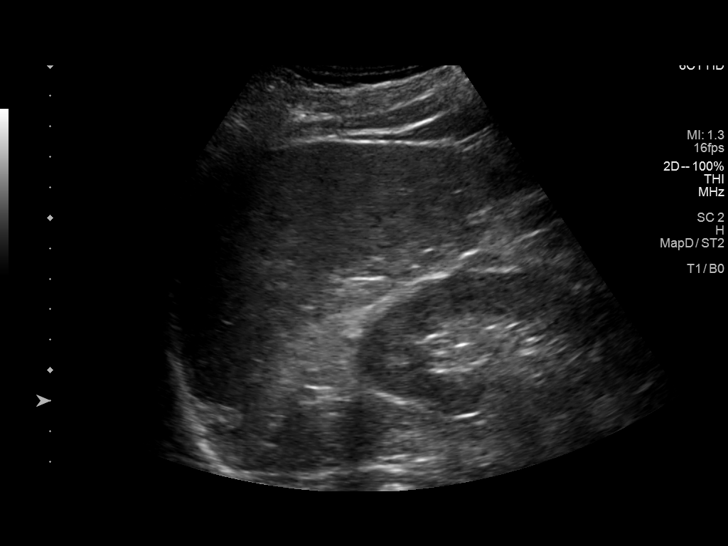
[im 3/28]
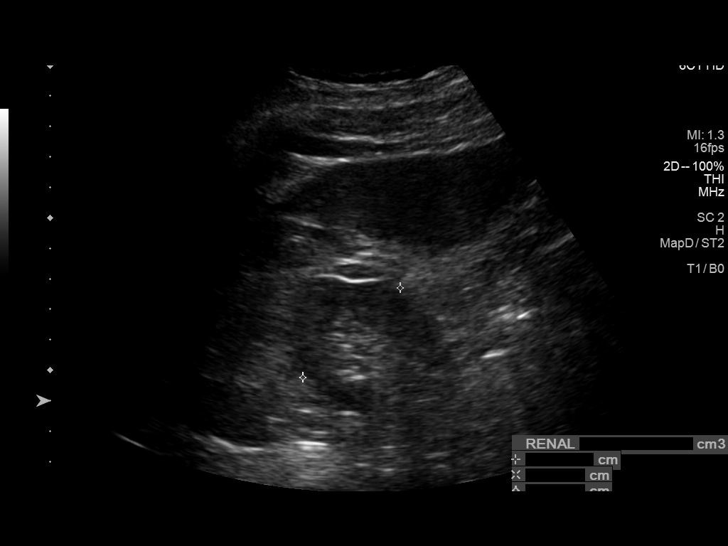
[im 5/28]
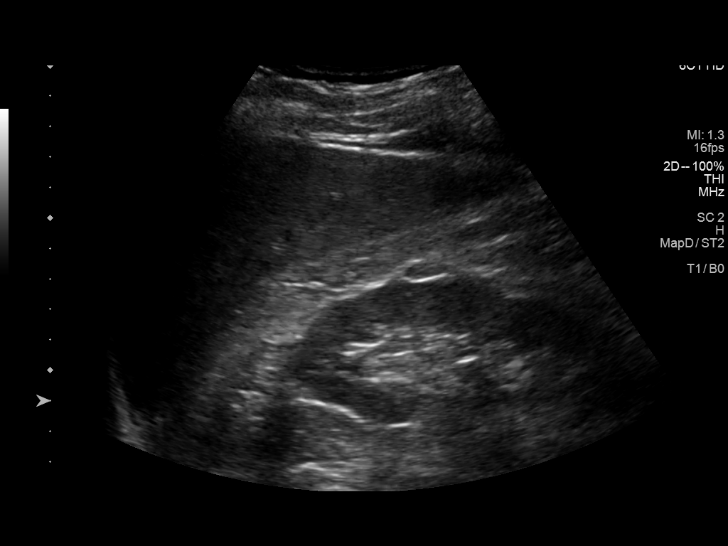
[im 7/28]
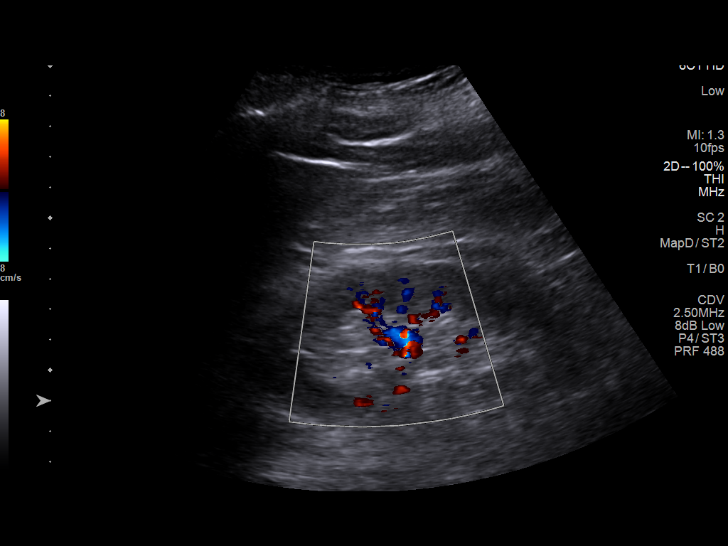
[im 10/28]
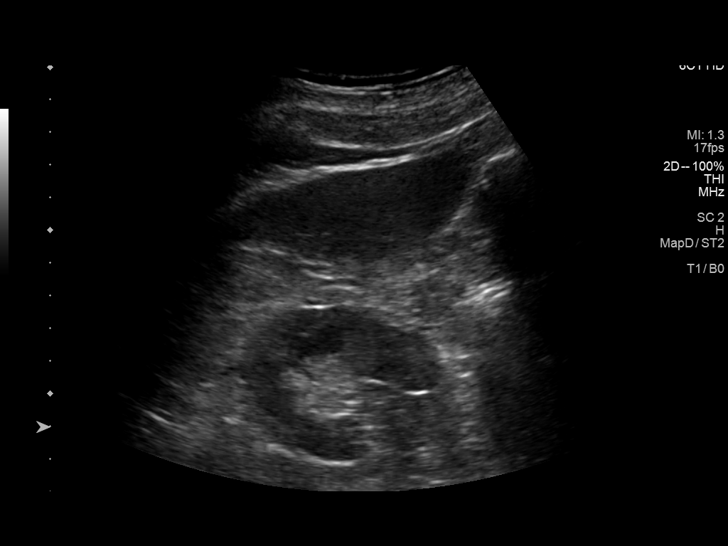
[im 11/28]
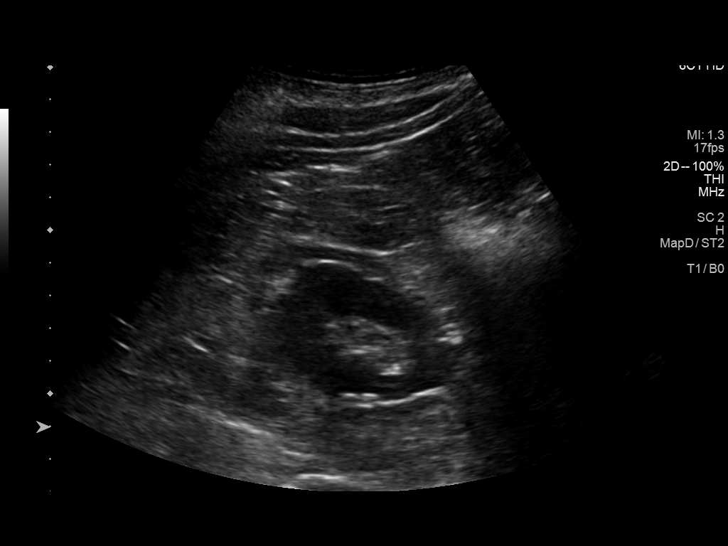
[im 13/28]
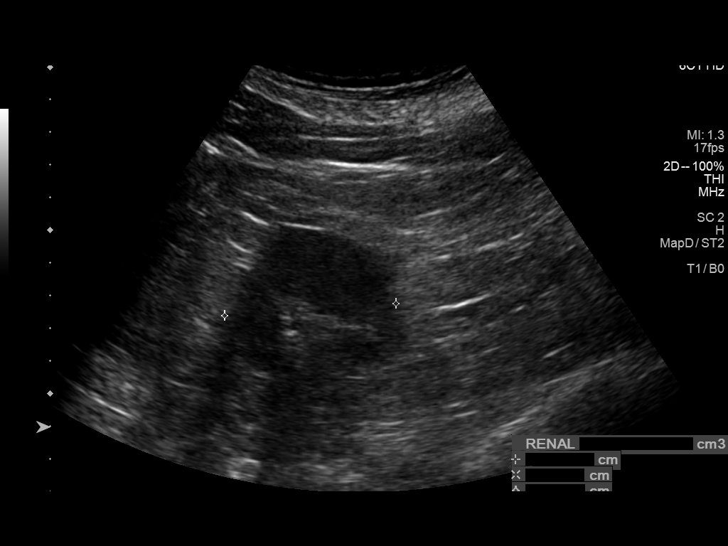
[im 15/28]
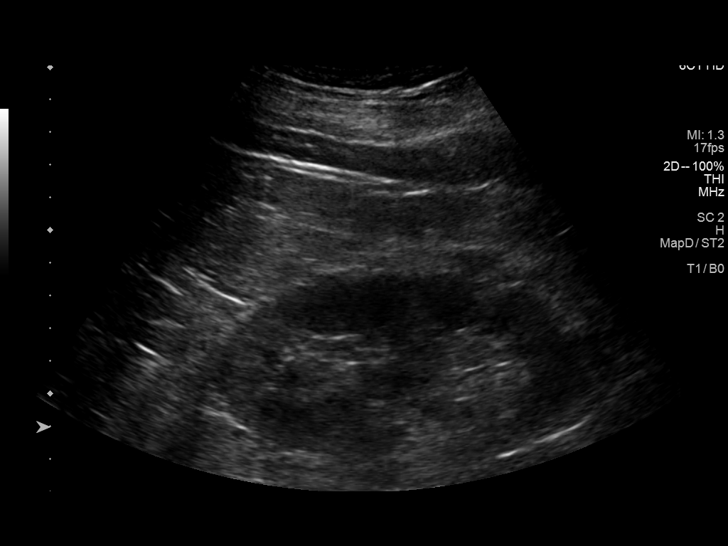
[im 17/28]
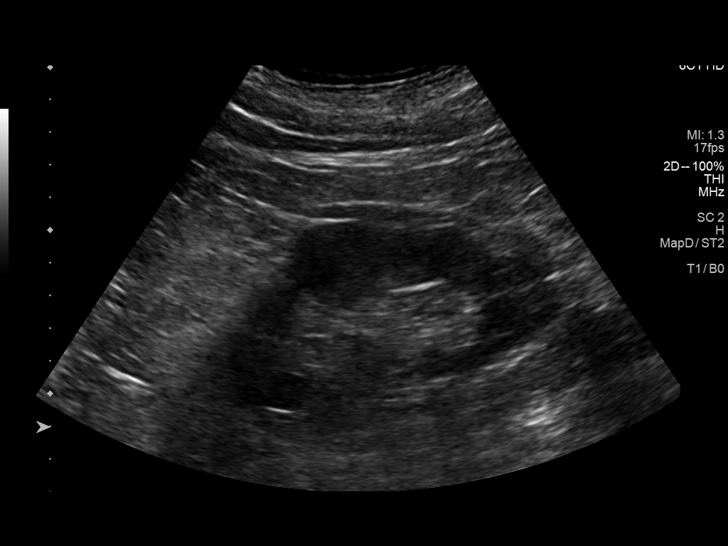
[im 19/28]
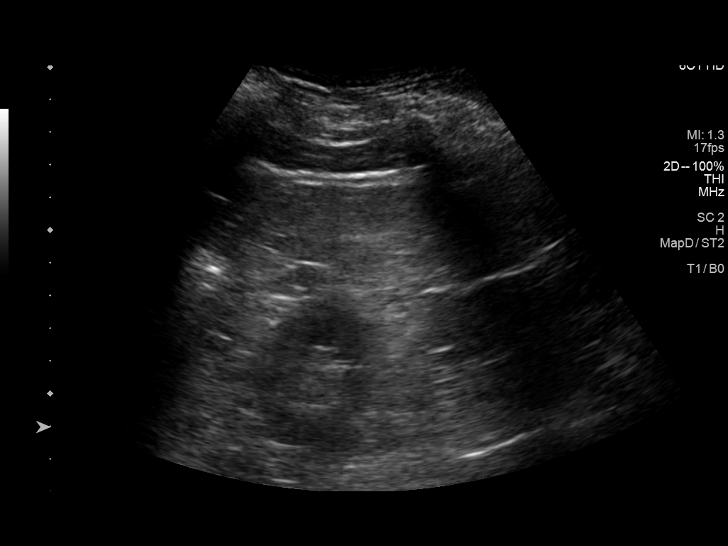
[im 21/28]
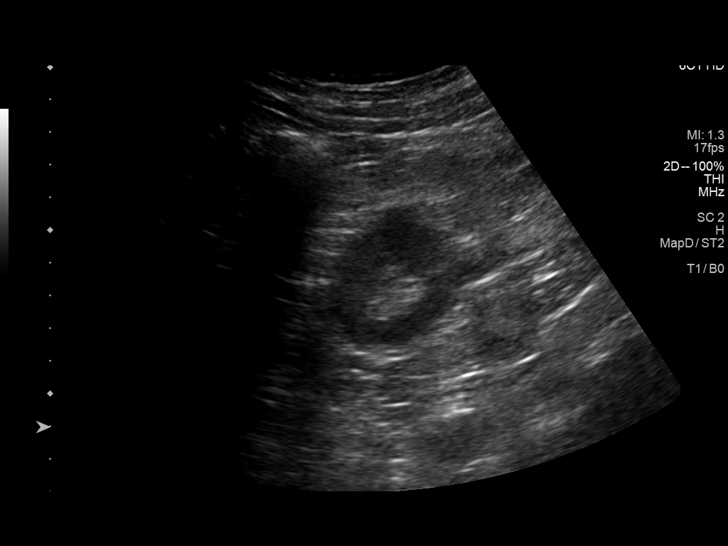
[im 23/28]
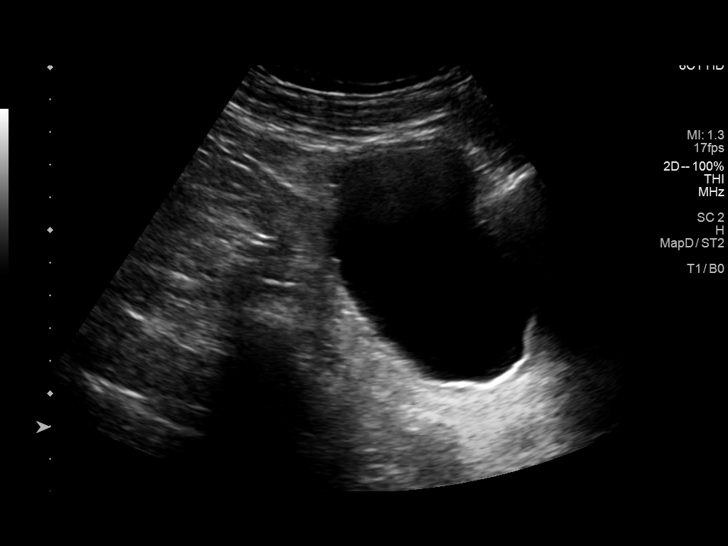
[im 25/28]
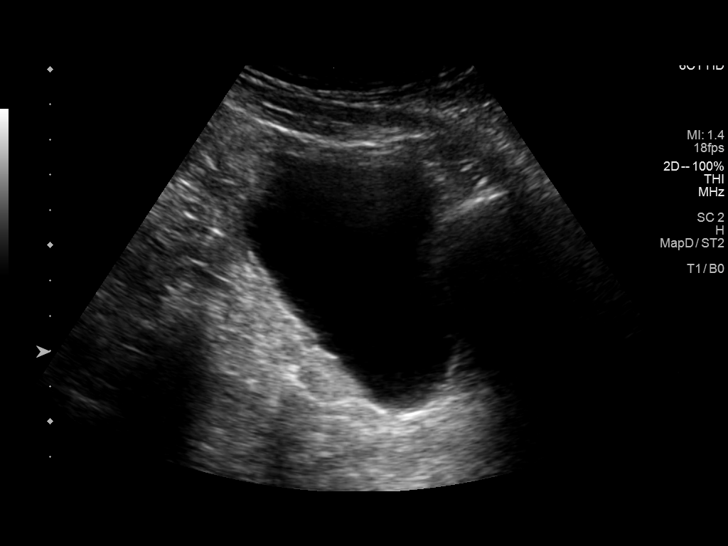
[im 28/28]
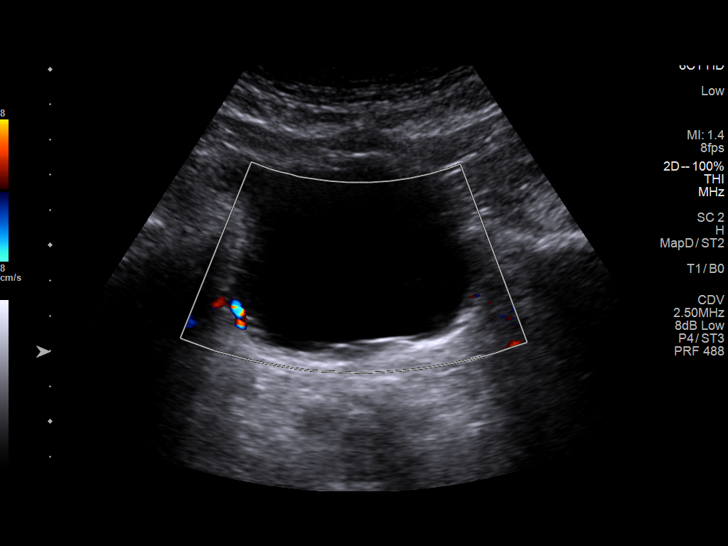

[14 of 25 positions shown; findings below may reference images not displayed]

FINDINGS: Right Kidney:

Renal measurements: 12.7 x 4.2 x 4.3 cm = volume: 121 mL.
Echogenicity within normal limits. No mass or hydronephrosis
visualized.

Left Kidney:

Renal measurements: 12.1 x 5.5 x 5.3 cm = volume: 183 mL.
Echogenicity within normal limits. No mass or hydronephrosis
visualized.

Bladder:

Appears normal for degree of bladder distention.

Other:

None.
IMPRESSION: No significant abnormality of kidneys or bladder.

## 2021-06-30 DIAGNOSIS — E113311 Type 2 diabetes mellitus with moderate nonproliferative diabetic retinopathy with macular edema, right eye: Secondary | ICD-10-CM | POA: Diagnosis not present

## 2021-07-04 DIAGNOSIS — E113312 Type 2 diabetes mellitus with moderate nonproliferative diabetic retinopathy with macular edema, left eye: Secondary | ICD-10-CM | POA: Diagnosis not present

## 2021-07-27 DIAGNOSIS — L570 Actinic keratosis: Secondary | ICD-10-CM | POA: Diagnosis not present

## 2021-07-27 DIAGNOSIS — Z85820 Personal history of malignant melanoma of skin: Secondary | ICD-10-CM | POA: Diagnosis not present

## 2021-07-27 DIAGNOSIS — C44311 Basal cell carcinoma of skin of nose: Secondary | ICD-10-CM | POA: Diagnosis not present

## 2021-07-27 DIAGNOSIS — D225 Melanocytic nevi of trunk: Secondary | ICD-10-CM | POA: Diagnosis not present

## 2021-07-27 DIAGNOSIS — L812 Freckles: Secondary | ICD-10-CM | POA: Diagnosis not present

## 2021-07-27 DIAGNOSIS — D485 Neoplasm of uncertain behavior of skin: Secondary | ICD-10-CM | POA: Diagnosis not present

## 2021-07-27 DIAGNOSIS — L821 Other seborrheic keratosis: Secondary | ICD-10-CM | POA: Diagnosis not present

## 2021-08-01 DIAGNOSIS — N1832 Chronic kidney disease, stage 3b: Secondary | ICD-10-CM | POA: Diagnosis not present

## 2021-08-03 DIAGNOSIS — N1832 Chronic kidney disease, stage 3b: Secondary | ICD-10-CM | POA: Diagnosis not present

## 2021-08-03 DIAGNOSIS — E875 Hyperkalemia: Secondary | ICD-10-CM | POA: Diagnosis not present

## 2021-08-03 DIAGNOSIS — E1122 Type 2 diabetes mellitus with diabetic chronic kidney disease: Secondary | ICD-10-CM | POA: Diagnosis not present

## 2021-08-03 DIAGNOSIS — I129 Hypertensive chronic kidney disease with stage 1 through stage 4 chronic kidney disease, or unspecified chronic kidney disease: Secondary | ICD-10-CM | POA: Diagnosis not present

## 2021-08-03 DIAGNOSIS — E669 Obesity, unspecified: Secondary | ICD-10-CM | POA: Diagnosis not present

## 2021-09-01 DIAGNOSIS — E113311 Type 2 diabetes mellitus with moderate nonproliferative diabetic retinopathy with macular edema, right eye: Secondary | ICD-10-CM | POA: Diagnosis not present

## 2021-09-05 DIAGNOSIS — E11319 Type 2 diabetes mellitus with unspecified diabetic retinopathy without macular edema: Secondary | ICD-10-CM | POA: Diagnosis not present

## 2021-09-05 DIAGNOSIS — E875 Hyperkalemia: Secondary | ICD-10-CM | POA: Diagnosis not present

## 2021-09-05 DIAGNOSIS — I10 Essential (primary) hypertension: Secondary | ICD-10-CM | POA: Diagnosis not present

## 2021-09-05 DIAGNOSIS — C61 Malignant neoplasm of prostate: Secondary | ICD-10-CM | POA: Diagnosis not present

## 2021-09-05 DIAGNOSIS — E782 Mixed hyperlipidemia: Secondary | ICD-10-CM | POA: Diagnosis not present

## 2021-09-05 DIAGNOSIS — Z79899 Other long term (current) drug therapy: Secondary | ICD-10-CM | POA: Diagnosis not present

## 2021-09-05 DIAGNOSIS — E1142 Type 2 diabetes mellitus with diabetic polyneuropathy: Secondary | ICD-10-CM | POA: Diagnosis not present

## 2021-09-05 DIAGNOSIS — N1832 Chronic kidney disease, stage 3b: Secondary | ICD-10-CM | POA: Diagnosis not present

## 2021-09-06 DIAGNOSIS — E113312 Type 2 diabetes mellitus with moderate nonproliferative diabetic retinopathy with macular edema, left eye: Secondary | ICD-10-CM | POA: Diagnosis not present

## 2021-09-12 DIAGNOSIS — C44311 Basal cell carcinoma of skin of nose: Secondary | ICD-10-CM | POA: Diagnosis not present

## 2021-09-12 DIAGNOSIS — Z85828 Personal history of other malignant neoplasm of skin: Secondary | ICD-10-CM | POA: Diagnosis not present

## 2021-09-21 DIAGNOSIS — E113313 Type 2 diabetes mellitus with moderate nonproliferative diabetic retinopathy with macular edema, bilateral: Secondary | ICD-10-CM | POA: Diagnosis not present

## 2021-09-21 DIAGNOSIS — H3582 Retinal ischemia: Secondary | ICD-10-CM | POA: Diagnosis not present

## 2021-09-21 DIAGNOSIS — H25813 Combined forms of age-related cataract, bilateral: Secondary | ICD-10-CM | POA: Diagnosis not present

## 2021-09-21 DIAGNOSIS — H35033 Hypertensive retinopathy, bilateral: Secondary | ICD-10-CM | POA: Diagnosis not present

## 2021-09-21 DIAGNOSIS — H35372 Puckering of macula, left eye: Secondary | ICD-10-CM | POA: Diagnosis not present

## 2021-09-29 DIAGNOSIS — N1832 Chronic kidney disease, stage 3b: Secondary | ICD-10-CM | POA: Diagnosis not present

## 2021-10-02 DIAGNOSIS — E872 Acidosis, unspecified: Secondary | ICD-10-CM | POA: Diagnosis not present

## 2021-10-02 DIAGNOSIS — N1832 Chronic kidney disease, stage 3b: Secondary | ICD-10-CM | POA: Diagnosis not present

## 2021-10-02 DIAGNOSIS — N2581 Secondary hyperparathyroidism of renal origin: Secondary | ICD-10-CM | POA: Diagnosis not present

## 2021-10-02 DIAGNOSIS — I129 Hypertensive chronic kidney disease with stage 1 through stage 4 chronic kidney disease, or unspecified chronic kidney disease: Secondary | ICD-10-CM | POA: Diagnosis not present

## 2021-10-02 DIAGNOSIS — E1122 Type 2 diabetes mellitus with diabetic chronic kidney disease: Secondary | ICD-10-CM | POA: Diagnosis not present

## 2021-10-02 DIAGNOSIS — E669 Obesity, unspecified: Secondary | ICD-10-CM | POA: Diagnosis not present

## 2021-10-02 DIAGNOSIS — E875 Hyperkalemia: Secondary | ICD-10-CM | POA: Diagnosis not present

## 2021-10-31 DIAGNOSIS — H2513 Age-related nuclear cataract, bilateral: Secondary | ICD-10-CM | POA: Diagnosis not present

## 2021-10-31 DIAGNOSIS — H35353 Cystoid macular degeneration, bilateral: Secondary | ICD-10-CM | POA: Diagnosis not present

## 2021-10-31 DIAGNOSIS — H5213 Myopia, bilateral: Secondary | ICD-10-CM | POA: Diagnosis not present

## 2021-11-03 DIAGNOSIS — E113311 Type 2 diabetes mellitus with moderate nonproliferative diabetic retinopathy with macular edema, right eye: Secondary | ICD-10-CM | POA: Diagnosis not present

## 2021-11-06 DIAGNOSIS — E113312 Type 2 diabetes mellitus with moderate nonproliferative diabetic retinopathy with macular edema, left eye: Secondary | ICD-10-CM | POA: Diagnosis not present

## 2021-11-14 DIAGNOSIS — Z85828 Personal history of other malignant neoplasm of skin: Secondary | ICD-10-CM | POA: Diagnosis not present

## 2021-11-14 DIAGNOSIS — D485 Neoplasm of uncertain behavior of skin: Secondary | ICD-10-CM | POA: Diagnosis not present

## 2021-11-14 DIAGNOSIS — L570 Actinic keratosis: Secondary | ICD-10-CM | POA: Diagnosis not present

## 2021-11-14 DIAGNOSIS — C44329 Squamous cell carcinoma of skin of other parts of face: Secondary | ICD-10-CM | POA: Diagnosis not present

## 2021-11-14 DIAGNOSIS — C4402 Squamous cell carcinoma of skin of lip: Secondary | ICD-10-CM | POA: Diagnosis not present

## 2021-11-15 DIAGNOSIS — H2512 Age-related nuclear cataract, left eye: Secondary | ICD-10-CM | POA: Diagnosis not present

## 2021-11-15 DIAGNOSIS — H269 Unspecified cataract: Secondary | ICD-10-CM | POA: Diagnosis not present

## 2021-11-15 DIAGNOSIS — H21562 Pupillary abnormality, left eye: Secondary | ICD-10-CM | POA: Diagnosis not present

## 2021-12-05 DIAGNOSIS — H3582 Retinal ischemia: Secondary | ICD-10-CM | POA: Diagnosis not present

## 2021-12-05 DIAGNOSIS — H59032 Cystoid macular edema following cataract surgery, left eye: Secondary | ICD-10-CM | POA: Diagnosis not present

## 2021-12-05 DIAGNOSIS — E113313 Type 2 diabetes mellitus with moderate nonproliferative diabetic retinopathy with macular edema, bilateral: Secondary | ICD-10-CM | POA: Diagnosis not present

## 2021-12-05 DIAGNOSIS — Z961 Presence of intraocular lens: Secondary | ICD-10-CM | POA: Diagnosis not present

## 2021-12-05 DIAGNOSIS — H25811 Combined forms of age-related cataract, right eye: Secondary | ICD-10-CM | POA: Diagnosis not present

## 2021-12-19 DIAGNOSIS — Z0000 Encounter for general adult medical examination without abnormal findings: Secondary | ICD-10-CM | POA: Diagnosis not present

## 2021-12-19 DIAGNOSIS — N529 Male erectile dysfunction, unspecified: Secondary | ICD-10-CM | POA: Diagnosis not present

## 2021-12-19 DIAGNOSIS — N1832 Chronic kidney disease, stage 3b: Secondary | ICD-10-CM | POA: Diagnosis not present

## 2021-12-19 DIAGNOSIS — E559 Vitamin D deficiency, unspecified: Secondary | ICD-10-CM | POA: Diagnosis not present

## 2021-12-19 DIAGNOSIS — Z1331 Encounter for screening for depression: Secondary | ICD-10-CM | POA: Diagnosis not present

## 2021-12-19 DIAGNOSIS — Z8546 Personal history of malignant neoplasm of prostate: Secondary | ICD-10-CM | POA: Diagnosis not present

## 2021-12-19 DIAGNOSIS — Z08 Encounter for follow-up examination after completed treatment for malignant neoplasm: Secondary | ICD-10-CM | POA: Diagnosis not present

## 2021-12-19 DIAGNOSIS — Z79899 Other long term (current) drug therapy: Secondary | ICD-10-CM | POA: Diagnosis not present

## 2021-12-19 DIAGNOSIS — E11311 Type 2 diabetes mellitus with unspecified diabetic retinopathy with macular edema: Secondary | ICD-10-CM | POA: Diagnosis not present

## 2021-12-19 DIAGNOSIS — I10 Essential (primary) hypertension: Secondary | ICD-10-CM | POA: Diagnosis not present

## 2021-12-19 DIAGNOSIS — E785 Hyperlipidemia, unspecified: Secondary | ICD-10-CM | POA: Diagnosis not present

## 2021-12-19 DIAGNOSIS — E1142 Type 2 diabetes mellitus with diabetic polyneuropathy: Secondary | ICD-10-CM | POA: Diagnosis not present

## 2021-12-19 DIAGNOSIS — E663 Overweight: Secondary | ICD-10-CM | POA: Diagnosis not present

## 2021-12-21 DIAGNOSIS — E113312 Type 2 diabetes mellitus with moderate nonproliferative diabetic retinopathy with macular edema, left eye: Secondary | ICD-10-CM | POA: Diagnosis not present

## 2021-12-27 DIAGNOSIS — Z85828 Personal history of other malignant neoplasm of skin: Secondary | ICD-10-CM | POA: Diagnosis not present

## 2021-12-27 DIAGNOSIS — C4402 Squamous cell carcinoma of skin of lip: Secondary | ICD-10-CM | POA: Diagnosis not present

## 2021-12-28 DIAGNOSIS — E872 Acidosis, unspecified: Secondary | ICD-10-CM | POA: Diagnosis not present

## 2021-12-28 DIAGNOSIS — E875 Hyperkalemia: Secondary | ICD-10-CM | POA: Diagnosis not present

## 2021-12-28 DIAGNOSIS — N2581 Secondary hyperparathyroidism of renal origin: Secondary | ICD-10-CM | POA: Diagnosis not present

## 2021-12-28 DIAGNOSIS — E1122 Type 2 diabetes mellitus with diabetic chronic kidney disease: Secondary | ICD-10-CM | POA: Diagnosis not present

## 2021-12-28 DIAGNOSIS — N1832 Chronic kidney disease, stage 3b: Secondary | ICD-10-CM | POA: Diagnosis not present

## 2021-12-28 DIAGNOSIS — I129 Hypertensive chronic kidney disease with stage 1 through stage 4 chronic kidney disease, or unspecified chronic kidney disease: Secondary | ICD-10-CM | POA: Diagnosis not present

## 2021-12-28 DIAGNOSIS — E669 Obesity, unspecified: Secondary | ICD-10-CM | POA: Diagnosis not present

## 2021-12-29 DIAGNOSIS — E113311 Type 2 diabetes mellitus with moderate nonproliferative diabetic retinopathy with macular edema, right eye: Secondary | ICD-10-CM | POA: Diagnosis not present

## 2022-01-15 DIAGNOSIS — E1140 Type 2 diabetes mellitus with diabetic neuropathy, unspecified: Secondary | ICD-10-CM | POA: Diagnosis not present

## 2022-01-31 DIAGNOSIS — L570 Actinic keratosis: Secondary | ICD-10-CM | POA: Diagnosis not present

## 2022-01-31 DIAGNOSIS — Z85828 Personal history of other malignant neoplasm of skin: Secondary | ICD-10-CM | POA: Diagnosis not present

## 2022-02-21 DIAGNOSIS — E113312 Type 2 diabetes mellitus with moderate nonproliferative diabetic retinopathy with macular edema, left eye: Secondary | ICD-10-CM | POA: Diagnosis not present

## 2022-02-28 DIAGNOSIS — E113311 Type 2 diabetes mellitus with moderate nonproliferative diabetic retinopathy with macular edema, right eye: Secondary | ICD-10-CM | POA: Diagnosis not present

## 2022-03-07 DIAGNOSIS — E1165 Type 2 diabetes mellitus with hyperglycemia: Secondary | ICD-10-CM | POA: Diagnosis not present

## 2022-03-14 DIAGNOSIS — H269 Unspecified cataract: Secondary | ICD-10-CM | POA: Diagnosis not present

## 2022-03-14 DIAGNOSIS — H2511 Age-related nuclear cataract, right eye: Secondary | ICD-10-CM | POA: Diagnosis not present

## 2022-03-14 DIAGNOSIS — H21561 Pupillary abnormality, right eye: Secondary | ICD-10-CM | POA: Diagnosis not present

## 2022-03-16 DIAGNOSIS — N1832 Chronic kidney disease, stage 3b: Secondary | ICD-10-CM | POA: Diagnosis not present

## 2022-03-19 DIAGNOSIS — N1832 Chronic kidney disease, stage 3b: Secondary | ICD-10-CM | POA: Diagnosis not present

## 2022-03-19 DIAGNOSIS — E1122 Type 2 diabetes mellitus with diabetic chronic kidney disease: Secondary | ICD-10-CM | POA: Diagnosis not present

## 2022-03-19 DIAGNOSIS — E669 Obesity, unspecified: Secondary | ICD-10-CM | POA: Diagnosis not present

## 2022-03-19 DIAGNOSIS — I129 Hypertensive chronic kidney disease with stage 1 through stage 4 chronic kidney disease, or unspecified chronic kidney disease: Secondary | ICD-10-CM | POA: Diagnosis not present

## 2022-03-19 DIAGNOSIS — E875 Hyperkalemia: Secondary | ICD-10-CM | POA: Diagnosis not present

## 2022-04-17 DIAGNOSIS — E1140 Type 2 diabetes mellitus with diabetic neuropathy, unspecified: Secondary | ICD-10-CM | POA: Diagnosis not present

## 2022-04-25 DIAGNOSIS — E113312 Type 2 diabetes mellitus with moderate nonproliferative diabetic retinopathy with macular edema, left eye: Secondary | ICD-10-CM | POA: Diagnosis not present

## 2022-05-02 DIAGNOSIS — E113311 Type 2 diabetes mellitus with moderate nonproliferative diabetic retinopathy with macular edema, right eye: Secondary | ICD-10-CM | POA: Diagnosis not present

## 2022-05-15 DIAGNOSIS — D123 Benign neoplasm of transverse colon: Secondary | ICD-10-CM | POA: Diagnosis not present

## 2022-05-15 DIAGNOSIS — Z09 Encounter for follow-up examination after completed treatment for conditions other than malignant neoplasm: Secondary | ICD-10-CM | POA: Diagnosis not present

## 2022-05-15 DIAGNOSIS — K648 Other hemorrhoids: Secondary | ICD-10-CM | POA: Diagnosis not present

## 2022-05-15 DIAGNOSIS — Z86010 Personal history of colonic polyps: Secondary | ICD-10-CM | POA: Diagnosis not present

## 2022-05-17 DIAGNOSIS — D123 Benign neoplasm of transverse colon: Secondary | ICD-10-CM | POA: Diagnosis not present

## 2022-06-21 DIAGNOSIS — Z794 Long term (current) use of insulin: Secondary | ICD-10-CM | POA: Diagnosis not present

## 2022-06-21 DIAGNOSIS — N1832 Chronic kidney disease, stage 3b: Secondary | ICD-10-CM | POA: Diagnosis not present

## 2022-06-21 DIAGNOSIS — E11311 Type 2 diabetes mellitus with unspecified diabetic retinopathy with macular edema: Secondary | ICD-10-CM | POA: Diagnosis not present

## 2022-06-21 DIAGNOSIS — E1142 Type 2 diabetes mellitus with diabetic polyneuropathy: Secondary | ICD-10-CM | POA: Diagnosis not present

## 2022-06-21 DIAGNOSIS — E785 Hyperlipidemia, unspecified: Secondary | ICD-10-CM | POA: Diagnosis not present

## 2022-06-21 DIAGNOSIS — E1122 Type 2 diabetes mellitus with diabetic chronic kidney disease: Secondary | ICD-10-CM | POA: Diagnosis not present

## 2022-06-21 DIAGNOSIS — I10 Essential (primary) hypertension: Secondary | ICD-10-CM | POA: Diagnosis not present

## 2022-06-21 DIAGNOSIS — N529 Male erectile dysfunction, unspecified: Secondary | ICD-10-CM | POA: Diagnosis not present

## 2022-06-21 DIAGNOSIS — E1165 Type 2 diabetes mellitus with hyperglycemia: Secondary | ICD-10-CM | POA: Diagnosis not present

## 2022-06-21 DIAGNOSIS — Z79899 Other long term (current) drug therapy: Secondary | ICD-10-CM | POA: Diagnosis not present

## 2022-07-11 DIAGNOSIS — E113312 Type 2 diabetes mellitus with moderate nonproliferative diabetic retinopathy with macular edema, left eye: Secondary | ICD-10-CM | POA: Diagnosis not present

## 2022-07-16 DIAGNOSIS — E1140 Type 2 diabetes mellitus with diabetic neuropathy, unspecified: Secondary | ICD-10-CM | POA: Diagnosis not present

## 2022-07-18 DIAGNOSIS — Z79899 Other long term (current) drug therapy: Secondary | ICD-10-CM | POA: Diagnosis not present

## 2022-08-06 DIAGNOSIS — N1832 Chronic kidney disease, stage 3b: Secondary | ICD-10-CM | POA: Diagnosis not present

## 2022-08-06 DIAGNOSIS — E113311 Type 2 diabetes mellitus with moderate nonproliferative diabetic retinopathy with macular edema, right eye: Secondary | ICD-10-CM | POA: Diagnosis not present

## 2022-08-06 DIAGNOSIS — E1122 Type 2 diabetes mellitus with diabetic chronic kidney disease: Secondary | ICD-10-CM | POA: Diagnosis not present

## 2022-08-06 DIAGNOSIS — E875 Hyperkalemia: Secondary | ICD-10-CM | POA: Diagnosis not present

## 2022-08-06 DIAGNOSIS — N2581 Secondary hyperparathyroidism of renal origin: Secondary | ICD-10-CM | POA: Diagnosis not present

## 2022-08-06 DIAGNOSIS — I129 Hypertensive chronic kidney disease with stage 1 through stage 4 chronic kidney disease, or unspecified chronic kidney disease: Secondary | ICD-10-CM | POA: Diagnosis not present

## 2022-08-06 DIAGNOSIS — E669 Obesity, unspecified: Secondary | ICD-10-CM | POA: Diagnosis not present

## 2022-08-13 DIAGNOSIS — Z85828 Personal history of other malignant neoplasm of skin: Secondary | ICD-10-CM | POA: Diagnosis not present

## 2022-08-13 DIAGNOSIS — L570 Actinic keratosis: Secondary | ICD-10-CM | POA: Diagnosis not present

## 2022-09-04 ENCOUNTER — Emergency Department (HOSPITAL_BASED_OUTPATIENT_CLINIC_OR_DEPARTMENT_OTHER): Payer: Medicare PPO | Admitting: Radiology

## 2022-09-04 ENCOUNTER — Other Ambulatory Visit (HOSPITAL_BASED_OUTPATIENT_CLINIC_OR_DEPARTMENT_OTHER): Payer: Self-pay

## 2022-09-04 ENCOUNTER — Emergency Department (HOSPITAL_BASED_OUTPATIENT_CLINIC_OR_DEPARTMENT_OTHER)
Admission: EM | Admit: 2022-09-04 | Discharge: 2022-09-04 | Disposition: A | Discharge status: Home / Self Care | Payer: Medicare PPO | Attending: Emergency Medicine | Admitting: Emergency Medicine

## 2022-09-04 ENCOUNTER — Other Ambulatory Visit: Payer: Self-pay

## 2022-09-04 ENCOUNTER — Encounter (HOSPITAL_BASED_OUTPATIENT_CLINIC_OR_DEPARTMENT_OTHER): Payer: Self-pay | Admitting: Emergency Medicine

## 2022-09-04 ENCOUNTER — Emergency Department (HOSPITAL_BASED_OUTPATIENT_CLINIC_OR_DEPARTMENT_OTHER): Payer: Medicare PPO

## 2022-09-04 DIAGNOSIS — W101XXA Fall (on)(from) sidewalk curb, initial encounter: Secondary | ICD-10-CM | POA: Insufficient documentation

## 2022-09-04 DIAGNOSIS — Z8546 Personal history of malignant neoplasm of prostate: Secondary | ICD-10-CM | POA: Diagnosis not present

## 2022-09-04 DIAGNOSIS — M47812 Spondylosis without myelopathy or radiculopathy, cervical region: Secondary | ICD-10-CM | POA: Diagnosis not present

## 2022-09-04 DIAGNOSIS — S199XXA Unspecified injury of neck, initial encounter: Secondary | ICD-10-CM | POA: Diagnosis not present

## 2022-09-04 DIAGNOSIS — S42292A Other displaced fracture of upper end of left humerus, initial encounter for closed fracture: Secondary | ICD-10-CM | POA: Insufficient documentation

## 2022-09-04 DIAGNOSIS — Z794 Long term (current) use of insulin: Secondary | ICD-10-CM | POA: Insufficient documentation

## 2022-09-04 DIAGNOSIS — Z7984 Long term (current) use of oral hypoglycemic drugs: Secondary | ICD-10-CM | POA: Diagnosis not present

## 2022-09-04 DIAGNOSIS — S0990XA Unspecified injury of head, initial encounter: Secondary | ICD-10-CM | POA: Insufficient documentation

## 2022-09-04 DIAGNOSIS — S42202A Unspecified fracture of upper end of left humerus, initial encounter for closed fracture: Secondary | ICD-10-CM | POA: Diagnosis not present

## 2022-09-04 DIAGNOSIS — M4312 Spondylolisthesis, cervical region: Secondary | ICD-10-CM | POA: Diagnosis not present

## 2022-09-04 DIAGNOSIS — Y92480 Sidewalk as the place of occurrence of the external cause: Secondary | ICD-10-CM | POA: Insufficient documentation

## 2022-09-04 DIAGNOSIS — I10 Essential (primary) hypertension: Secondary | ICD-10-CM | POA: Insufficient documentation

## 2022-09-04 DIAGNOSIS — Z7982 Long term (current) use of aspirin: Secondary | ICD-10-CM | POA: Insufficient documentation

## 2022-09-04 DIAGNOSIS — E119 Type 2 diabetes mellitus without complications: Secondary | ICD-10-CM | POA: Insufficient documentation

## 2022-09-04 DIAGNOSIS — Z79899 Other long term (current) drug therapy: Secondary | ICD-10-CM | POA: Diagnosis not present

## 2022-09-04 DIAGNOSIS — S022XXA Fracture of nasal bones, initial encounter for closed fracture: Secondary | ICD-10-CM | POA: Diagnosis not present

## 2022-09-04 DIAGNOSIS — M25512 Pain in left shoulder: Secondary | ICD-10-CM | POA: Diagnosis present

## 2022-09-04 DIAGNOSIS — S0031XA Abrasion of nose, initial encounter: Secondary | ICD-10-CM

## 2022-09-04 LAB — CBG MONITORING, ED: Glucose-Capillary: 142 mg/dL — ABNORMAL HIGH (ref 70–99)

## 2022-09-04 MED ORDER — OXYCODONE HCL 5 MG PO TABS
5.0000 mg | ORAL_TABLET | ORAL | 0 refills | Status: AC | PRN
  Filled 2022-09-04: qty 20, 4d supply, fill #0

## 2022-09-04 MED ORDER — OXYCODONE-ACETAMINOPHEN 5-325 MG PO TABS
2.0000 | ORAL_TABLET | Freq: Once | ORAL | Status: AC
  Administered 2022-09-04: 2 via ORAL
  Filled 2022-09-04: qty 2

## 2022-09-04 NOTE — ED Provider Notes (Signed)
[place] [facility]ERGENC[facility] DEPARTMENT AT [facility] Provider Note   CSN: [id number] Arrival date & time: 09/04/22  0623     History  Chief Complaint  Patient presents with   Fall   Shoulder Injury    Anthony Moyer is a 72 y.o. male with past medical history of hypertension, diabetes, hypercholesterolemia, prostate cancer presents to the ED after a fall.  Patient states he tripped on a concrete curb this morning and landed on his face and left shoulder.  Patient states he is unable to move his left arm.  He did not lose consciousness.  Denies blood thinner use.  Denies other injury related to fall, nausea, vomiting, headache, numbness, neck pain.         Home Medications Prior to Admission medications   Medication Sig Start Date End Date Taking? Authorizing Provider  BD PEN NEEDLE NANO 2ND GEN 32G X 4 MM MISC 1 each by Other route daily. 06/29/22  Yes [provider]  chlorthalidone (HYGROTON) 25 MG tablet Take 12.5 mg by mouth daily. 06/23/22  Yes [provider]  clindamycin (CLEOCIN) 300 MG capsule Take 300 mg by mouth 3 (three) times daily. 08/17/22  Yes [provider]  ibuprofen (ADVIL) 400 MG tablet Take 400 mg by mouth every 4 (four) hours as needed. 08/17/22  Yes [provider]  oxyCODONE (ROXICODONE) 5 MG immediate release tablet Take 1 tablet (5 mg total) by mouth every 4 (four) hours as needed for severe pain. 09/04/22  Yes Addasyn Mcbreen R, PA-C  OZEMPIC, 0.25 OR 0.5 MG/DOSE, 2 MG/3ML SOPN Inject 0.5 mg into the skin See admin instructions. Every 4 weeks 08/13/22  Yes [provider]  acetaminophen (TYLENOL) 500 MG tablet Take 500 mg by mouth every 6 (six) hours as needed for mild pain.    [provider]  amLODipine (NORVASC) 5 MG tablet Take 5 mg by mouth daily. 10/20/20   [provider]  aspirin EC 81 MG tablet Take 81 mg by mouth daily.    [provider]  cholecalciferol (VITAMIN D) 1000 units  tablet Take 1,000 Units by mouth daily.    [provider]  empagliflozin (JARDIANCE) 10 MG TABS tablet Take 10 mg by mouth daily.    [provider]  insulin degludec (TRESIBA) 100 UNIT/ML FlexTouch Pen Inject 80 Units into the skin daily at 10 pm.    [provider]  lisinopril (ZESTRIL) 10 MG tablet Take 10 mg by mouth daily. 10/20/20   [provider]  LOKELMA 10 g PACK packet Take 10 g by mouth daily. 10/24/20   [provider]  metFORMIN (GLUCOPHAGE) 1000 MG tablet Take 500 mg by mouth 2 (two) times daily with a meal.    [provider]  pantoprazole (PROTONIX) 40 MG tablet Take 40 mg by mouth daily as needed for heartburn.    [provider]  simvastatin (ZOCOR) 80 MG tablet Take 80 mg by mouth daily. Morning    [provider]      Allergies    Penicillins    Review of Systems   Review of Systems  Gastrointestinal:  Negative for nausea and vomiting.  Musculoskeletal:  Positive for arthralgias (left shoulder/arm pain). Negative for neck pain.  Skin:  Positive for wound (facial abrasion).  Neurological:  Negative for syncope, numbness and headaches.    Physical Exam Updated Vital Signs BP (!) 140/68 (BP Location: Left Arm)   Pulse 72   Temp 98.1 F (  36.7 C) (Oral)   Resp 16   Ht 5\' 8"  (1.727 m)   Wt 81.6 kg   SpO2 98%   BMI 27.37 kg/m  Physical Exam Vitals and nursing note reviewed.  Constitutional:      General: He is not in acute distress.    Appearance: Normal appearance. He is not ill-appearing or diaphoretic.  HENT:     Head: Normocephalic and atraumatic.     Nose: Signs of injury (abrasion over bridge of nose, minimal bleeding) and nasal tenderness present. No nasal deformity or laceration.     Mouth/Throat:     Lips: Pink.     Mouth: Mucous membranes are moist. No injury.     Comments: Swelling appreciated to upper lip.  No obvious dental injuries.   Cardiovascular:     Rate and Rhythm:  Normal rate and regular rhythm.  Pulmonary:     Effort: Pulmonary effort is normal.  Musculoskeletal:     Left shoulder: Tenderness and bony tenderness present. No swelling or deformity. Decreased range of motion.       Arms:     Cervical back: Full passive range of motion without pain.  Skin:    General: Skin is warm and dry.     Capillary Refill: Capillary refill takes less than 2 seconds.  Neurological:     Mental Status: He is alert. Mental status is at baseline.  Psychiatric:        Mood and Affect: Mood normal.        Behavior: Behavior normal.     ED Results / Procedures / Treatments   Labs (all labs ordered are listed, but only abnormal results are displayed) Labs Reviewed  CBG MONITORING, ED - Abnormal; Notable for the following components:      Result Value   Glucose-Capillary 142 (*)    All other components within normal limits    EKG None  Radiology CT Maxillofacial Wo Contrast  Result Date: 09/04/2022 CLINICAL DATA:  Provided history: Neck trauma. Facial trauma, blunt. Neck trauma. Additional history obtained: Fall on concrete this morning (landing on face), abrasion to bridge of nose, blood in nostrils. EXAM: CT HEAD WITHOUT CONTRAST CT MAXILLOFACIAL WITHOUT CONTRAST CT CERVICAL SPINE WITHOUT CONTRAST TECHNIQUE: Multidetector CT imaging of the head, cervical spine, and maxillofacial structures were performed using the standard protocol without intravenous contrast. Multiplanar CT image reconstructions of the cervical spine and maxillofacial structures were also generated. RADIATION DOSE REDUCTION: This exam was performed according to the departmental dose-optimization program which includes automated exposure control, adjustment of the mA and/or kV according to patient size and/or use of iterative reconstruction technique. COMPARISON:  None. FINDINGS: CT HEAD FINDINGS Brain: Cerebral volume is normal. There is no acute intracranial hemorrhage. No demarcated cortical  infarct. No extra-axial fluid collection. No evidence of an intracranial mass. No midline shift. Vascular: No hyperdense vessel.  Atherosclerotic calcifications. Skull: No calvarial fracture or aggressive osseous lesion. CT MAXILLOFACIAL FINDINGS Osseous: No acute maxillofacial fracture is identified. Orbits: No acute orbital finding. Sinuses: Moderate mucosal thickening within anterior right ethmoid air cells. Severe mucosal thickening within the right maxillary sinus. Soft tissues: No maxillofacial hematoma is appreciable by CT. CT CERVICAL SPINE FINDINGS Alignment: 2 mm C4-C5 grade 1 anterolisthesis. Skull base and vertebrae: The basion-dental and atlanto-dental intervals are maintained.No evidence of acute fracture to the cervical spine. Soft tissues and spinal canal: No prevertebral fluid or swelling. No visible canal hematoma. 14 mm calcified nodule within the left thyroid lobe, not meeting  consensus criteria for ultrasound follow-up based on size. No follow-up imaging recommended. Reference: J Am Coll Radiol. 2015 Feb;12(2): 143-50. Disc levels: Cervical spondylosis with multilevel disc space narrowing, disc bulges/central disc protrusions and facet arthrosis. Endplate spurring and bilateral uncovertebral hypertrophy at C5-C6. Disc space narrowing is greatest at C5-C6 (moderate to moderately advanced at this level). No appreciable high-grade spinal canal stenosis. Uncovertebral hypertrophy results in right-sided bony neural foraminal narrowing at C5-C6. Upper chest: No consolidation within the imaged lung apices. No visible pneumothorax IMPRESSION: CT head: No evidence of an acute intracranial abnormality. CT maxillofacial: 1. No evidence of an acute maxillofacial fracture. 2. Paranasal sinus disease as described. CT cervical spine: 1. No evidence of an acute cervical spine fracture. 2. Mild C4-C5 grade 1 anterolisthesis. 3. Cervical spondylosis as described. Electronically Signed   By: Jackey Loge D.O.    On: 09/04/2022 11:36   CT Head Wo Contrast  Result Date: 09/04/2022 CLINICAL DATA:  Provided history: Neck trauma. Facial trauma, blunt. Neck trauma. Additional history obtained: Fall on concrete this morning (landing on face), abrasion to bridge of nose, blood in nostrils. EXAM: CT HEAD WITHOUT CONTRAST CT MAXILLOFACIAL WITHOUT CONTRAST CT CERVICAL SPINE WITHOUT CONTRAST TECHNIQUE: Multidetector CT imaging of the head, cervical spine, and maxillofacial structures were performed using the standard protocol without intravenous contrast. Multiplanar CT image reconstructions of the cervical spine and maxillofacial structures were also generated. RADIATION DOSE REDUCTION: This exam was performed according to the departmental dose-optimization program which includes automated exposure control, adjustment of the mA and/or kV according to patient size and/or use of iterative reconstruction technique. COMPARISON:  None. FINDINGS: CT HEAD FINDINGS Brain: Cerebral volume is normal. There is no acute intracranial hemorrhage. No demarcated cortical infarct. No extra-axial fluid collection. No evidence of an intracranial mass. No midline shift. Vascular: No hyperdense vessel.  Atherosclerotic calcifications. Skull: No calvarial fracture or aggressive osseous lesion. CT MAXILLOFACIAL FINDINGS Osseous: No acute maxillofacial fracture is identified. Orbits: No acute orbital finding. Sinuses: Moderate mucosal thickening within anterior right ethmoid air cells. Severe mucosal thickening within the right maxillary sinus. Soft tissues: No maxillofacial hematoma is appreciable by CT. CT CERVICAL SPINE FINDINGS Alignment: 2 mm C4-C5 grade 1 anterolisthesis. Skull base and vertebrae: The basion-dental and atlanto-dental intervals are maintained.No evidence of acute fracture to the cervical spine. Soft tissues and spinal canal: No prevertebral fluid or swelling. No visible canal hematoma. 14 mm calcified nodule within the left thyroid  lobe, not meeting consensus criteria for ultrasound follow-up based on size. No follow-up imaging recommended. Reference: J Am Coll Radiol. 2015 Feb;12(2): 143-50. Disc levels: Cervical spondylosis with multilevel disc space narrowing, disc bulges/central disc protrusions and facet arthrosis. Endplate spurring and bilateral uncovertebral hypertrophy at C5-C6. Disc space narrowing is greatest at C5-C6 (moderate to moderately advanced at this level). No appreciable high-grade spinal canal stenosis. Uncovertebral hypertrophy results in right-sided bony neural foraminal narrowing at C5-C6. Upper chest: No consolidation within the imaged lung apices. No visible pneumothorax IMPRESSION: CT head: No evidence of an acute intracranial abnormality. CT maxillofacial: 1. No evidence of an acute maxillofacial fracture. 2. Paranasal sinus disease as described. CT cervical spine: 1. No evidence of an acute cervical spine fracture. 2. Mild C4-C5 grade 1 anterolisthesis. 3. Cervical spondylosis as described. Electronically Signed   By: Jackey Loge D.O.   On: 09/04/2022 11:36   CT Cervical Spine Wo Contrast  Result Date: 09/04/2022 CLINICAL DATA:  Provided history: Neck trauma. Facial trauma, blunt. Neck trauma. Additional history obtained: Fall  on concrete this morning (landing on face), abrasion to bridge of nose, blood in nostrils. EXAM: CT HEAD WITHOUT CONTRAST CT MAXILLOFACIAL WITHOUT CONTRAST CT CERVICAL SPINE WITHOUT CONTRAST TECHNIQUE: Multidetector CT imaging of the head, cervical spine, and maxillofacial structures were performed using the standard protocol without intravenous contrast. Multiplanar CT image reconstructions of the cervical spine and maxillofacial structures were also generated. RADIATION DOSE REDUCTION: This exam was performed according to the departmental dose-optimization program which includes automated exposure control, adjustment of the mA and/or kV according to patient size and/or use of iterative  reconstruction technique. COMPARISON:  None. FINDINGS: CT HEAD FINDINGS Brain: Cerebral volume is normal. There is no acute intracranial hemorrhage. No demarcated cortical infarct. No extra-axial fluid collection. No evidence of an intracranial mass. No midline shift. Vascular: No hyperdense vessel.  Atherosclerotic calcifications. Skull: No calvarial fracture or aggressive osseous lesion. CT MAXILLOFACIAL FINDINGS Osseous: No acute maxillofacial fracture is identified. Orbits: No acute orbital finding. Sinuses: Moderate mucosal thickening within anterior right ethmoid air cells. Severe mucosal thickening within the right maxillary sinus. Soft tissues: No maxillofacial hematoma is appreciable by CT. CT CERVICAL SPINE FINDINGS Alignment: 2 mm C4-C5 grade 1 anterolisthesis. Skull base and vertebrae: The basion-dental and atlanto-dental intervals are maintained.No evidence of acute fracture to the cervical spine. Soft tissues and spinal canal: No prevertebral fluid or swelling. No visible canal hematoma. 14 mm calcified nodule within the left thyroid lobe, not meeting consensus criteria for ultrasound follow-up based on size. No follow-up imaging recommended. Reference: J Am Coll Radiol. 2015 Feb;12(2): 143-50. Disc levels: Cervical spondylosis with multilevel disc space narrowing, disc bulges/central disc protrusions and facet arthrosis. Endplate spurring and bilateral uncovertebral hypertrophy at C5-C6. Disc space narrowing is greatest at C5-C6 (moderate to moderately advanced at this level). No appreciable high-grade spinal canal stenosis. Uncovertebral hypertrophy results in right-sided bony neural foraminal narrowing at C5-C6. Upper chest: No consolidation within the imaged lung apices. No visible pneumothorax IMPRESSION: CT head: No evidence of an acute intracranial abnormality. CT maxillofacial: 1. No evidence of an acute maxillofacial fracture. 2. Paranasal sinus disease as described. CT cervical spine: 1. No  evidence of an acute cervical spine fracture. 2. Mild C4-C5 grade 1 anterolisthesis. 3. Cervical spondylosis as described. Electronically Signed   By: Jackey Loge D.O.   On: 09/04/2022 11:36   DG Shoulder Left  Result Date: 09/04/2022 CLINICAL DATA:  Post fall, now with left shoulder pain. EXAM: LEFT SHOULDER - 2+ VIEW COMPARISON:  None Available. FINDINGS: There is a acute comminuted slightly impacted fracture of the anatomic neck of the left humerus. No definite intra-articular extension. Glenohumeral and acromioclavicular joint spaces appear preserved. No evidence of calcific tendinitis. Limited visualization of the adjacent thorax demonstrates minimal left basilar atelectasis. Expected adjacent soft tissue swelling. No radiopaque foreign body. IMPRESSION: Acute comminuted slightly impacted fracture of the anatomic neck of the left humerus. Electronically Signed   By: Simonne Come M.D.   On: 09/04/2022 11:02    Procedures Procedures    Medications Ordered in ED Medications  oxyCODONE-acetaminophen (PERCOCET/ROXICET) 5-325 MG per tablet 2 tablet (2 tablets Oral Given 09/04/22 5284)    ED Course/ Medical Decision Making/ A&P                                 Medical Decision Making Amount and/or Complexity of Data Reviewed Radiology: ordered.  Risk Prescription drug management.   This patient presents to the ED  with chief complaint(s) of injuries from fall. The complaint involves an extensive differential diagnosis and also carries with it a high risk of complications and morbidity.    The differential diagnosis includes acute fracture or dislocation of left shoulder, contusion, maxillofacial fracture, intracranial injury, cervical spine fracture or subluxation   The initial plan is to obtain imaging  Initial Assessment:   On exam, patient appears to be uncomfortable but is not in acute distress.  Left arm is supported by a pillow.  Patient has severely decreased ROM of the left  shoulder due to pain.  There is tenderness to palpation over the humeral head.  No obvious deformity, ecchymosis, or appreciable swelling.  Radial pulse 2+.  Skin is warm and dry.  Small abrasion over the bridge of the nose.  Swelling appreciated to the nose and upper lip.  No obvious mouth or dental injuries.    Independent visualization and interpretation of imaging: I independently visualized the following imaging with scope of interpretation limited to determining acute life threatening conditions related to emergency care: Left shoulder x-ray, which revealed acute comminuted slightly impacted fracture of the anatomic neck of the left humerus.  CT head, C-spine, and maxillofacial without acute findings.  Treatment and Reassessment: Patient given pain medicine with improvement in pain symptoms.  He was placed in a sling to immobilize the left shoulder.  Disposition:   Advised patient to schedule a follow-up appointment with orthopedic provider.  Patient and his wife unsure which orthopedic provider they would like to use, multiple referral information provided.  Will send patient home on short course of pain medicine for severe pain.  Recommended Tylenol as needed for mild to moderate pain.  Discussed proper use of shoulder sling for immobilization.  The patient has been appropriately medically screened and/or stabilized in the ED. I have low suspicion for any other emergent medical condition which would require further screening, evaluation or treatment in the ED or require inpatient management. At time of discharge the patient is hemodynamically stable and in no acute distress. I have discussed work-up results and diagnosis with patient and answered all questions. Patient is agreeable with discharge plan. We discussed strict return precautions for returning to the emergency department and they verbalized understanding.            Final Clinical Impression(s) / ED Diagnoses Final  diagnoses:  Humeral head fracture, left, closed, initial encounter  Abrasion of nose, initial encounter    Rx / DC Orders ED Discharge Orders          Ordered    oxyCODONE (ROXICODONE) 5 MG immediate release tablet  Every 4 hours PRN        09/04/22 1214              Melton Alar R, PA-C 09/04/22 1452    Sloan Leiter, DO 09/05/22 2008

## 2022-09-04 NOTE — Discharge Instructions (Addendum)
Thank you for allowing [place] to be a part of your care today.  You were evaluated for injuries related to a fall.  Your x-ray shows that you have a fracture to the left humeral head (E the ball of the ball-and-socket to your shoulder).  You were treated with a immobilization sling.  Please keep this on 24/7 until you follow-up with orthopedics.  You may take this off to shower, however, do not use your left arm and keep it in a neutral and comfortable position.  I am sending you home with a prescription pain medicine to use for severe pain.  I recommend taking 500 to 1000 mg of Tylenol every 6-8 hours as needed in addition to this.  Do not exceed 4000 mg of Tylenol in a 24-hour period.  Please call one of the orthopedic offices to schedule a follow-up appointment.  Return to the ED if you develop sudden worsening of your symptoms or if you have any new concerns.

## 2022-09-04 NOTE — ED Triage Notes (Signed)
Pt arrives to ED with c/o fall. Pt notes he tripped on concrete this morning and landed on his left shoulder and face.

## 2022-09-12 DIAGNOSIS — S42215A Unspecified nondisplaced fracture of surgical neck of left humerus, initial encounter for closed fracture: Secondary | ICD-10-CM | POA: Diagnosis not present

## 2022-09-20 DIAGNOSIS — S42202D Unspecified fracture of upper end of left humerus, subsequent encounter for fracture with routine healing: Secondary | ICD-10-CM | POA: Diagnosis not present

## 2022-09-20 DIAGNOSIS — M25642 Stiffness of left hand, not elsewhere classified: Secondary | ICD-10-CM | POA: Diagnosis not present

## 2022-09-20 DIAGNOSIS — M25612 Stiffness of left shoulder, not elsewhere classified: Secondary | ICD-10-CM | POA: Diagnosis not present

## 2022-09-24 DIAGNOSIS — M25612 Stiffness of left shoulder, not elsewhere classified: Secondary | ICD-10-CM | POA: Diagnosis not present

## 2022-09-24 DIAGNOSIS — M25642 Stiffness of left hand, not elsewhere classified: Secondary | ICD-10-CM | POA: Diagnosis not present

## 2022-09-24 DIAGNOSIS — E1165 Type 2 diabetes mellitus with hyperglycemia: Secondary | ICD-10-CM | POA: Diagnosis not present

## 2022-09-24 DIAGNOSIS — E1142 Type 2 diabetes mellitus with diabetic polyneuropathy: Secondary | ICD-10-CM | POA: Diagnosis not present

## 2022-09-24 DIAGNOSIS — S42202D Unspecified fracture of upper end of left humerus, subsequent encounter for fracture with routine healing: Secondary | ICD-10-CM | POA: Diagnosis not present

## 2022-09-28 DIAGNOSIS — M25612 Stiffness of left shoulder, not elsewhere classified: Secondary | ICD-10-CM | POA: Diagnosis not present

## 2022-09-28 DIAGNOSIS — M25642 Stiffness of left hand, not elsewhere classified: Secondary | ICD-10-CM | POA: Diagnosis not present

## 2022-09-28 DIAGNOSIS — S42202D Unspecified fracture of upper end of left humerus, subsequent encounter for fracture with routine healing: Secondary | ICD-10-CM | POA: Diagnosis not present

## 2022-10-01 DIAGNOSIS — S42202D Unspecified fracture of upper end of left humerus, subsequent encounter for fracture with routine healing: Secondary | ICD-10-CM | POA: Diagnosis not present

## 2022-10-01 DIAGNOSIS — M25612 Stiffness of left shoulder, not elsewhere classified: Secondary | ICD-10-CM | POA: Diagnosis not present

## 2022-10-01 DIAGNOSIS — M25642 Stiffness of left hand, not elsewhere classified: Secondary | ICD-10-CM | POA: Diagnosis not present

## 2022-10-01 DIAGNOSIS — R609 Edema, unspecified: Secondary | ICD-10-CM | POA: Diagnosis not present

## 2022-10-02 DIAGNOSIS — E113312 Type 2 diabetes mellitus with moderate nonproliferative diabetic retinopathy with macular edema, left eye: Secondary | ICD-10-CM | POA: Diagnosis not present

## 2022-10-03 DIAGNOSIS — S42215D Unspecified nondisplaced fracture of surgical neck of left humerus, subsequent encounter for fracture with routine healing: Secondary | ICD-10-CM | POA: Diagnosis not present

## 2022-10-03 DIAGNOSIS — M25612 Stiffness of left shoulder, not elsewhere classified: Secondary | ICD-10-CM | POA: Diagnosis not present

## 2022-10-03 DIAGNOSIS — M25642 Stiffness of left hand, not elsewhere classified: Secondary | ICD-10-CM | POA: Diagnosis not present

## 2022-10-03 DIAGNOSIS — S42202D Unspecified fracture of upper end of left humerus, subsequent encounter for fracture with routine healing: Secondary | ICD-10-CM | POA: Diagnosis not present

## 2022-10-09 DIAGNOSIS — M25642 Stiffness of left hand, not elsewhere classified: Secondary | ICD-10-CM | POA: Diagnosis not present

## 2022-10-09 DIAGNOSIS — M25612 Stiffness of left shoulder, not elsewhere classified: Secondary | ICD-10-CM | POA: Diagnosis not present

## 2022-10-09 DIAGNOSIS — S42202D Unspecified fracture of upper end of left humerus, subsequent encounter for fracture with routine healing: Secondary | ICD-10-CM | POA: Diagnosis not present

## 2022-10-12 DIAGNOSIS — M25642 Stiffness of left hand, not elsewhere classified: Secondary | ICD-10-CM | POA: Diagnosis not present

## 2022-10-12 DIAGNOSIS — S42202D Unspecified fracture of upper end of left humerus, subsequent encounter for fracture with routine healing: Secondary | ICD-10-CM | POA: Diagnosis not present

## 2022-10-12 DIAGNOSIS — M25612 Stiffness of left shoulder, not elsewhere classified: Secondary | ICD-10-CM | POA: Diagnosis not present

## 2022-10-14 DIAGNOSIS — E1140 Type 2 diabetes mellitus with diabetic neuropathy, unspecified: Secondary | ICD-10-CM | POA: Diagnosis not present

## 2022-10-16 DIAGNOSIS — E113311 Type 2 diabetes mellitus with moderate nonproliferative diabetic retinopathy with macular edema, right eye: Secondary | ICD-10-CM | POA: Diagnosis not present

## 2022-10-19 DIAGNOSIS — M25612 Stiffness of left shoulder, not elsewhere classified: Secondary | ICD-10-CM | POA: Diagnosis not present

## 2022-10-19 DIAGNOSIS — M25642 Stiffness of left hand, not elsewhere classified: Secondary | ICD-10-CM | POA: Diagnosis not present

## 2022-10-19 DIAGNOSIS — S42202D Unspecified fracture of upper end of left humerus, subsequent encounter for fracture with routine healing: Secondary | ICD-10-CM | POA: Diagnosis not present

## 2022-10-22 DIAGNOSIS — M25612 Stiffness of left shoulder, not elsewhere classified: Secondary | ICD-10-CM | POA: Diagnosis not present

## 2022-10-22 DIAGNOSIS — M25642 Stiffness of left hand, not elsewhere classified: Secondary | ICD-10-CM | POA: Diagnosis not present

## 2022-10-22 DIAGNOSIS — S42202D Unspecified fracture of upper end of left humerus, subsequent encounter for fracture with routine healing: Secondary | ICD-10-CM | POA: Diagnosis not present

## 2022-10-26 DIAGNOSIS — M25612 Stiffness of left shoulder, not elsewhere classified: Secondary | ICD-10-CM | POA: Diagnosis not present

## 2022-10-26 DIAGNOSIS — M25642 Stiffness of left hand, not elsewhere classified: Secondary | ICD-10-CM | POA: Diagnosis not present

## 2022-10-26 DIAGNOSIS — S42202D Unspecified fracture of upper end of left humerus, subsequent encounter for fracture with routine healing: Secondary | ICD-10-CM | POA: Diagnosis not present

## 2022-10-29 DIAGNOSIS — M25642 Stiffness of left hand, not elsewhere classified: Secondary | ICD-10-CM | POA: Diagnosis not present

## 2022-10-29 DIAGNOSIS — S42202D Unspecified fracture of upper end of left humerus, subsequent encounter for fracture with routine healing: Secondary | ICD-10-CM | POA: Diagnosis not present

## 2022-10-29 DIAGNOSIS — M25612 Stiffness of left shoulder, not elsewhere classified: Secondary | ICD-10-CM | POA: Diagnosis not present

## 2022-11-01 DIAGNOSIS — M25642 Stiffness of left hand, not elsewhere classified: Secondary | ICD-10-CM | POA: Diagnosis not present

## 2022-11-01 DIAGNOSIS — M79642 Pain in left hand: Secondary | ICD-10-CM | POA: Diagnosis not present

## 2022-11-01 DIAGNOSIS — S42202D Unspecified fracture of upper end of left humerus, subsequent encounter for fracture with routine healing: Secondary | ICD-10-CM | POA: Diagnosis not present

## 2022-11-01 DIAGNOSIS — M25612 Stiffness of left shoulder, not elsewhere classified: Secondary | ICD-10-CM | POA: Diagnosis not present

## 2022-11-08 DIAGNOSIS — Z961 Presence of intraocular lens: Secondary | ICD-10-CM | POA: Diagnosis not present

## 2022-12-03 DIAGNOSIS — E113312 Type 2 diabetes mellitus with moderate nonproliferative diabetic retinopathy with macular edema, left eye: Secondary | ICD-10-CM | POA: Diagnosis not present

## 2022-12-19 DIAGNOSIS — E113311 Type 2 diabetes mellitus with moderate nonproliferative diabetic retinopathy with macular edema, right eye: Secondary | ICD-10-CM | POA: Diagnosis not present

## 2022-12-25 DIAGNOSIS — E785 Hyperlipidemia, unspecified: Secondary | ICD-10-CM | POA: Diagnosis not present

## 2022-12-25 DIAGNOSIS — Z0000 Encounter for general adult medical examination without abnormal findings: Secondary | ICD-10-CM | POA: Diagnosis not present

## 2022-12-25 DIAGNOSIS — E1142 Type 2 diabetes mellitus with diabetic polyneuropathy: Secondary | ICD-10-CM | POA: Diagnosis not present

## 2022-12-25 DIAGNOSIS — I10 Essential (primary) hypertension: Secondary | ICD-10-CM | POA: Diagnosis not present

## 2022-12-25 DIAGNOSIS — Z9181 History of falling: Secondary | ICD-10-CM | POA: Diagnosis not present

## 2023-01-12 DIAGNOSIS — E1140 Type 2 diabetes mellitus with diabetic neuropathy, unspecified: Secondary | ICD-10-CM | POA: Diagnosis not present

## 2023-01-16 DIAGNOSIS — E113312 Type 2 diabetes mellitus with moderate nonproliferative diabetic retinopathy with macular edema, left eye: Secondary | ICD-10-CM | POA: Diagnosis not present

## 2023-02-11 DIAGNOSIS — N1832 Chronic kidney disease, stage 3b: Secondary | ICD-10-CM | POA: Diagnosis not present

## 2023-02-14 DIAGNOSIS — Z85828 Personal history of other malignant neoplasm of skin: Secondary | ICD-10-CM | POA: Diagnosis not present

## 2023-02-14 DIAGNOSIS — L812 Freckles: Secondary | ICD-10-CM | POA: Diagnosis not present

## 2023-02-14 DIAGNOSIS — L570 Actinic keratosis: Secondary | ICD-10-CM | POA: Diagnosis not present

## 2023-02-14 DIAGNOSIS — L578 Other skin changes due to chronic exposure to nonionizing radiation: Secondary | ICD-10-CM | POA: Diagnosis not present

## 2023-02-14 DIAGNOSIS — Z85820 Personal history of malignant melanoma of skin: Secondary | ICD-10-CM | POA: Diagnosis not present

## 2023-02-14 DIAGNOSIS — D225 Melanocytic nevi of trunk: Secondary | ICD-10-CM | POA: Diagnosis not present

## 2023-02-14 DIAGNOSIS — L821 Other seborrheic keratosis: Secondary | ICD-10-CM | POA: Diagnosis not present

## 2023-02-14 DIAGNOSIS — D692 Other nonthrombocytopenic purpura: Secondary | ICD-10-CM | POA: Diagnosis not present

## 2023-02-18 DIAGNOSIS — N1832 Chronic kidney disease, stage 3b: Secondary | ICD-10-CM | POA: Diagnosis not present

## 2023-02-18 DIAGNOSIS — E1122 Type 2 diabetes mellitus with diabetic chronic kidney disease: Secondary | ICD-10-CM | POA: Diagnosis not present

## 2023-02-18 DIAGNOSIS — N2581 Secondary hyperparathyroidism of renal origin: Secondary | ICD-10-CM | POA: Diagnosis not present

## 2023-02-18 DIAGNOSIS — E875 Hyperkalemia: Secondary | ICD-10-CM | POA: Diagnosis not present

## 2023-02-18 DIAGNOSIS — E669 Obesity, unspecified: Secondary | ICD-10-CM | POA: Diagnosis not present

## 2023-02-18 DIAGNOSIS — I129 Hypertensive chronic kidney disease with stage 1 through stage 4 chronic kidney disease, or unspecified chronic kidney disease: Secondary | ICD-10-CM | POA: Diagnosis not present

## 2023-02-27 DIAGNOSIS — E113311 Type 2 diabetes mellitus with moderate nonproliferative diabetic retinopathy with macular edema, right eye: Secondary | ICD-10-CM | POA: Diagnosis not present

## 2023-03-13 DIAGNOSIS — E113312 Type 2 diabetes mellitus with moderate nonproliferative diabetic retinopathy with macular edema, left eye: Secondary | ICD-10-CM | POA: Diagnosis not present

## 2023-04-12 DIAGNOSIS — E11311 Type 2 diabetes mellitus with unspecified diabetic retinopathy with macular edema: Secondary | ICD-10-CM | POA: Diagnosis not present

## 2023-04-12 DIAGNOSIS — E559 Vitamin D deficiency, unspecified: Secondary | ICD-10-CM | POA: Diagnosis not present

## 2023-04-12 DIAGNOSIS — I10 Essential (primary) hypertension: Secondary | ICD-10-CM | POA: Diagnosis not present

## 2023-04-12 DIAGNOSIS — E1142 Type 2 diabetes mellitus with diabetic polyneuropathy: Secondary | ICD-10-CM | POA: Diagnosis not present

## 2023-04-12 DIAGNOSIS — N529 Male erectile dysfunction, unspecified: Secondary | ICD-10-CM | POA: Diagnosis not present

## 2023-04-12 DIAGNOSIS — E663 Overweight: Secondary | ICD-10-CM | POA: Diagnosis not present

## 2023-04-12 DIAGNOSIS — Z8546 Personal history of malignant neoplasm of prostate: Secondary | ICD-10-CM | POA: Diagnosis not present

## 2023-04-12 DIAGNOSIS — E785 Hyperlipidemia, unspecified: Secondary | ICD-10-CM | POA: Diagnosis not present

## 2023-04-12 DIAGNOSIS — Z79899 Other long term (current) drug therapy: Secondary | ICD-10-CM | POA: Diagnosis not present

## 2023-04-23 DIAGNOSIS — E1140 Type 2 diabetes mellitus with diabetic neuropathy, unspecified: Secondary | ICD-10-CM | POA: Diagnosis not present

## 2023-04-29 DIAGNOSIS — N184 Chronic kidney disease, stage 4 (severe): Secondary | ICD-10-CM | POA: Diagnosis not present

## 2023-04-29 DIAGNOSIS — I10 Essential (primary) hypertension: Secondary | ICD-10-CM | POA: Diagnosis not present

## 2023-04-29 DIAGNOSIS — E1142 Type 2 diabetes mellitus with diabetic polyneuropathy: Secondary | ICD-10-CM | POA: Diagnosis not present

## 2023-05-08 DIAGNOSIS — E113311 Type 2 diabetes mellitus with moderate nonproliferative diabetic retinopathy with macular edema, right eye: Secondary | ICD-10-CM | POA: Diagnosis not present

## 2023-05-22 DIAGNOSIS — E113312 Type 2 diabetes mellitus with moderate nonproliferative diabetic retinopathy with macular edema, left eye: Secondary | ICD-10-CM | POA: Diagnosis not present

## 2023-07-15 DIAGNOSIS — E113311 Type 2 diabetes mellitus with moderate nonproliferative diabetic retinopathy with macular edema, right eye: Secondary | ICD-10-CM | POA: Diagnosis not present

## 2023-07-22 DIAGNOSIS — E113312 Type 2 diabetes mellitus with moderate nonproliferative diabetic retinopathy with macular edema, left eye: Secondary | ICD-10-CM | POA: Diagnosis not present

## 2023-07-23 DIAGNOSIS — E559 Vitamin D deficiency, unspecified: Secondary | ICD-10-CM | POA: Diagnosis not present

## 2023-07-23 DIAGNOSIS — E1169 Type 2 diabetes mellitus with other specified complication: Secondary | ICD-10-CM | POA: Diagnosis not present

## 2023-07-23 DIAGNOSIS — Z8546 Personal history of malignant neoplasm of prostate: Secondary | ICD-10-CM | POA: Diagnosis not present

## 2023-07-23 DIAGNOSIS — I10 Essential (primary) hypertension: Secondary | ICD-10-CM | POA: Diagnosis not present

## 2023-07-23 DIAGNOSIS — E1140 Type 2 diabetes mellitus with diabetic neuropathy, unspecified: Secondary | ICD-10-CM | POA: Diagnosis not present

## 2023-07-23 DIAGNOSIS — R809 Proteinuria, unspecified: Secondary | ICD-10-CM | POA: Diagnosis not present

## 2023-07-23 DIAGNOSIS — E1165 Type 2 diabetes mellitus with hyperglycemia: Secondary | ICD-10-CM | POA: Diagnosis not present

## 2023-07-23 DIAGNOSIS — E113312 Type 2 diabetes mellitus with moderate nonproliferative diabetic retinopathy with macular edema, left eye: Secondary | ICD-10-CM | POA: Diagnosis not present

## 2023-07-23 DIAGNOSIS — N184 Chronic kidney disease, stage 4 (severe): Secondary | ICD-10-CM | POA: Diagnosis not present

## 2023-07-23 DIAGNOSIS — R635 Abnormal weight gain: Secondary | ICD-10-CM | POA: Diagnosis not present

## 2023-08-08 ENCOUNTER — Telehealth: Payer: Self-pay | Admitting: Pharmacist

## 2023-08-08 NOTE — Progress Notes (Signed)
   08/08/2023  Patient ID: Anthony Moyer, male   DOB: 24-Dec-1950, 73 y.o.   MRN: [id number]  Received message from med adherence team regarding Lisinopril.  Called and spoke with the patient on the phone today. Reports he has been cutting the 10mg  tablets in half. Advised he should be due for a refill of the 5mg  tablets if needed. Will call the pharmacy and have them refill it. Also requested pen needle script to be sent into the pharmacy. Refill sent. Also wanted to notify [facility] doctor's that since he switched from injecting his insulin in the leg to his stomach, his readings have looked better.    Aloysius Lewis, PharmD [facility]  Phone Number: [phone]

## 2023-08-20 DIAGNOSIS — M25512 Pain in left shoulder: Secondary | ICD-10-CM | POA: Diagnosis not present

## 2023-08-20 DIAGNOSIS — N184 Chronic kidney disease, stage 4 (severe): Secondary | ICD-10-CM | POA: Diagnosis not present

## 2023-08-20 DIAGNOSIS — R809 Proteinuria, unspecified: Secondary | ICD-10-CM | POA: Diagnosis not present

## 2023-08-20 DIAGNOSIS — E1165 Type 2 diabetes mellitus with hyperglycemia: Secondary | ICD-10-CM | POA: Diagnosis not present

## 2023-08-20 DIAGNOSIS — E113312 Type 2 diabetes mellitus with moderate nonproliferative diabetic retinopathy with macular edema, left eye: Secondary | ICD-10-CM | POA: Diagnosis not present

## 2023-08-20 DIAGNOSIS — E785 Hyperlipidemia, unspecified: Secondary | ICD-10-CM | POA: Diagnosis not present

## 2023-08-20 DIAGNOSIS — E1122 Type 2 diabetes mellitus with diabetic chronic kidney disease: Secondary | ICD-10-CM | POA: Diagnosis not present

## 2023-08-20 DIAGNOSIS — I10 Essential (primary) hypertension: Secondary | ICD-10-CM | POA: Diagnosis not present

## 2023-08-20 DIAGNOSIS — M25511 Pain in right shoulder: Secondary | ICD-10-CM | POA: Diagnosis not present

## 2023-08-23 DIAGNOSIS — N2581 Secondary hyperparathyroidism of renal origin: Secondary | ICD-10-CM | POA: Diagnosis not present

## 2023-08-23 DIAGNOSIS — I129 Hypertensive chronic kidney disease with stage 1 through stage 4 chronic kidney disease, or unspecified chronic kidney disease: Secondary | ICD-10-CM | POA: Diagnosis not present

## 2023-08-23 DIAGNOSIS — E669 Obesity, unspecified: Secondary | ICD-10-CM | POA: Diagnosis not present

## 2023-08-23 DIAGNOSIS — E1122 Type 2 diabetes mellitus with diabetic chronic kidney disease: Secondary | ICD-10-CM | POA: Diagnosis not present

## 2023-08-23 DIAGNOSIS — N184 Chronic kidney disease, stage 4 (severe): Secondary | ICD-10-CM | POA: Diagnosis not present

## 2023-08-29 DIAGNOSIS — E1165 Type 2 diabetes mellitus with hyperglycemia: Secondary | ICD-10-CM | POA: Diagnosis not present

## 2023-08-29 DIAGNOSIS — E559 Vitamin D deficiency, unspecified: Secondary | ICD-10-CM | POA: Diagnosis not present

## 2023-08-29 DIAGNOSIS — E113312 Type 2 diabetes mellitus with moderate nonproliferative diabetic retinopathy with macular edema, left eye: Secondary | ICD-10-CM | POA: Diagnosis not present

## 2023-08-29 DIAGNOSIS — N1832 Chronic kidney disease, stage 3b: Secondary | ICD-10-CM | POA: Diagnosis not present

## 2023-09-10 DIAGNOSIS — E113312 Type 2 diabetes mellitus with moderate nonproliferative diabetic retinopathy with macular edema, left eye: Secondary | ICD-10-CM | POA: Diagnosis not present

## 2023-09-18 DIAGNOSIS — E113311 Type 2 diabetes mellitus with moderate nonproliferative diabetic retinopathy with macular edema, right eye: Secondary | ICD-10-CM | POA: Diagnosis not present

## 2023-09-20 ENCOUNTER — Ambulatory Visit: Admitting: Dietician

## 2023-10-29 DIAGNOSIS — E1140 Type 2 diabetes mellitus with diabetic neuropathy, unspecified: Secondary | ICD-10-CM | POA: Diagnosis not present

## 2023-11-11 DIAGNOSIS — N184 Chronic kidney disease, stage 4 (severe): Secondary | ICD-10-CM | POA: Diagnosis not present

## 2023-11-11 DIAGNOSIS — E1165 Type 2 diabetes mellitus with hyperglycemia: Secondary | ICD-10-CM | POA: Diagnosis not present

## 2023-11-11 DIAGNOSIS — I959 Hypotension, unspecified: Secondary | ICD-10-CM | POA: Diagnosis not present

## 2023-11-11 DIAGNOSIS — E1169 Type 2 diabetes mellitus with other specified complication: Secondary | ICD-10-CM | POA: Diagnosis not present

## 2023-11-11 DIAGNOSIS — E113312 Type 2 diabetes mellitus with moderate nonproliferative diabetic retinopathy with macular edema, left eye: Secondary | ICD-10-CM | POA: Diagnosis not present

## 2023-11-11 DIAGNOSIS — N1832 Chronic kidney disease, stage 3b: Secondary | ICD-10-CM | POA: Diagnosis not present

## 2023-11-11 DIAGNOSIS — I10 Essential (primary) hypertension: Secondary | ICD-10-CM | POA: Diagnosis not present

## 2023-11-11 DIAGNOSIS — R809 Proteinuria, unspecified: Secondary | ICD-10-CM | POA: Diagnosis not present

## 2023-11-12 DIAGNOSIS — E113312 Type 2 diabetes mellitus with moderate nonproliferative diabetic retinopathy with macular edema, left eye: Secondary | ICD-10-CM | POA: Diagnosis not present

## 2023-11-14 DIAGNOSIS — N184 Chronic kidney disease, stage 4 (severe): Secondary | ICD-10-CM | POA: Diagnosis not present

## 2023-11-14 DIAGNOSIS — E875 Hyperkalemia: Secondary | ICD-10-CM | POA: Diagnosis not present

## 2023-11-14 DIAGNOSIS — N2581 Secondary hyperparathyroidism of renal origin: Secondary | ICD-10-CM | POA: Diagnosis not present

## 2023-11-14 DIAGNOSIS — I129 Hypertensive chronic kidney disease with stage 1 through stage 4 chronic kidney disease, or unspecified chronic kidney disease: Secondary | ICD-10-CM | POA: Diagnosis not present

## 2023-11-14 DIAGNOSIS — E1122 Type 2 diabetes mellitus with diabetic chronic kidney disease: Secondary | ICD-10-CM | POA: Diagnosis not present
